# Patient Record
Sex: Female | Born: 1940 | Race: White | Hispanic: No | Marital: Single | Smoking: Never smoker
Health system: Southern US, Community
[De-identification: ages and names within clinical notes are randomized; demographics above are authoritative.]

## PROBLEM LIST (undated history)

## (undated) DIAGNOSIS — F0390 Unspecified dementia without behavioral disturbance: Secondary | ICD-10-CM

## (undated) DIAGNOSIS — E119 Type 2 diabetes mellitus without complications: Secondary | ICD-10-CM

## (undated) DIAGNOSIS — F209 Schizophrenia, unspecified: Secondary | ICD-10-CM

## (undated) DIAGNOSIS — I10 Essential (primary) hypertension: Secondary | ICD-10-CM

## (undated) HISTORY — PX: ABDOMINAL HYSTERECTOMY: SHX81

## (undated) HISTORY — PX: TONSILLECTOMY: SUR1361

---

## 2012-11-23 ENCOUNTER — Ambulatory Visit: Payer: Self-pay

## 2012-11-23 LAB — CBC WITH DIFFERENTIAL/PLATELET
Basophil %: 0.6 %
Eosinophil #: 0.2 10*3/uL (ref 0.0–0.7)
Eosinophil %: 2.1 %
HGB: 13.6 g/dL (ref 12.0–16.0)
Lymphocyte #: 2 10*3/uL (ref 1.0–3.6)
MCH: 31 pg (ref 26.0–34.0)
MCHC: 34.4 g/dL (ref 32.0–36.0)
MCV: 90 fL (ref 80–100)
Monocyte #: 0.6 x10 3/mm (ref 0.2–0.9)
Neutrophil %: 63.2 %
Platelet: 155 10*3/uL (ref 150–440)
RBC: 4.4 10*6/uL (ref 3.80–5.20)

## 2012-11-23 LAB — COMPREHENSIVE METABOLIC PANEL
Albumin: 4.5 g/dL (ref 3.4–5.0)
Alkaline Phosphatase: 139 U/L — ABNORMAL HIGH (ref 50–136)
Anion Gap: 8 (ref 7–16)
BUN: 17 mg/dL (ref 7–18)
Chloride: 101 mmol/L (ref 98–107)
Co2: 28 mmol/L (ref 21–32)
Creatinine: 1.05 mg/dL (ref 0.60–1.30)
EGFR (African American): >60
Potassium: 4.1 mmol/L (ref 3.5–5.1)
SGPT (ALT): 28 U/L (ref 12–78)
Total Protein: 9.2 g/dL — ABNORMAL HIGH (ref 6.4–8.2)

## 2012-11-23 LAB — URINALYSIS, COMPLETE
Bilirubin,UR: NEGATIVE
Glucose,UR: NEGATIVE mg/dL (ref 0–75)
Ketone: NEGATIVE
Nitrite: POSITIVE

## 2012-11-25 LAB — URINE CULTURE

## 2013-05-16 ENCOUNTER — Ambulatory Visit: Payer: Self-pay | Admitting: Physician Assistant

## 2015-03-12 ENCOUNTER — Encounter: Payer: Self-pay | Admitting: *Deleted

## 2015-03-12 ENCOUNTER — Emergency Department
Admission: EM | Admit: 2015-03-12 | Discharge: 2015-03-13 | Disposition: A | Discharge status: Other Acute Inpatient Hospital | Payer: Medicare Other | Attending: Emergency Medicine | Admitting: Emergency Medicine

## 2015-03-12 DIAGNOSIS — K5900 Constipation, unspecified: Secondary | ICD-10-CM | POA: Diagnosis not present

## 2015-03-12 DIAGNOSIS — F060 Psychotic disorder with hallucinations due to known physiological condition: Secondary | ICD-10-CM | POA: Diagnosis not present

## 2015-03-12 DIAGNOSIS — E119 Type 2 diabetes mellitus without complications: Secondary | ICD-10-CM | POA: Diagnosis not present

## 2015-03-12 DIAGNOSIS — I10 Essential (primary) hypertension: Secondary | ICD-10-CM | POA: Diagnosis not present

## 2015-03-12 DIAGNOSIS — F22 Delusional disorders: Secondary | ICD-10-CM | POA: Diagnosis not present

## 2015-03-12 DIAGNOSIS — F99 Mental disorder, not otherwise specified: Secondary | ICD-10-CM | POA: Diagnosis present

## 2015-03-12 HISTORY — DX: Essential (primary) hypertension: I10

## 2015-03-12 HISTORY — DX: Type 2 diabetes mellitus without complications: E11.9

## 2015-03-12 LAB — URINALYSIS COMPLETE WITH MICROSCOPIC (ARMC ONLY)
BILIRUBIN URINE: NEGATIVE
GLUCOSE, UA: 50 mg/dL — AB
Hgb urine dipstick: NEGATIVE
Leukocytes, UA: NEGATIVE
NITRITE: NEGATIVE
PH: 5 (ref 5.0–8.0)
Protein, ur: 100 mg/dL — AB
Specific Gravity, Urine: 1.021 (ref 1.005–1.030)

## 2015-03-12 LAB — URINE DRUG SCREEN, QUALITATIVE (ARMC ONLY)
Amphetamines, Ur Screen: NOT DETECTED
BARBITURATES, UR SCREEN: NOT DETECTED
Benzodiazepine, Ur Scrn: NOT DETECTED
COCAINE METABOLITE, UR ~~LOC~~: NOT DETECTED
Cannabinoid 50 Ng, Ur ~~LOC~~: NOT DETECTED
MDMA (ECSTASY) UR SCREEN: NOT DETECTED
METHADONE SCREEN, URINE: NOT DETECTED
Opiate, Ur Screen: NOT DETECTED
Phencyclidine (PCP) Ur S: NOT DETECTED
TRICYCLIC, UR SCREEN: NOT DETECTED

## 2015-03-12 LAB — CBC
HCT: 36.1 % (ref 35.0–47.0)
HEMOGLOBIN: 12.2 g/dL (ref 12.0–16.0)
MCH: 30.3 pg (ref 26.0–34.0)
MCHC: 33.7 g/dL (ref 32.0–36.0)
MCV: 89.6 fL (ref 80.0–100.0)
Platelets: 170 10*3/uL (ref 150–440)
RBC: 4.02 MIL/uL (ref 3.80–5.20)
RDW: 14.4 % (ref 11.5–14.5)
WBC: 5.2 10*3/uL (ref 3.6–11.0)

## 2015-03-12 LAB — SALICYLATE LEVEL

## 2015-03-12 LAB — COMPREHENSIVE METABOLIC PANEL
ALT: 17 U/L (ref 14–54)
ANION GAP: 11 (ref 5–15)
AST: 22 U/L (ref 15–41)
Albumin: 4.6 g/dL (ref 3.5–5.0)
Alkaline Phosphatase: 71 U/L (ref 38–126)
BUN: 29 mg/dL — ABNORMAL HIGH (ref 6–20)
CHLORIDE: 106 mmol/L (ref 101–111)
CO2: 24 mmol/L (ref 22–32)
Calcium: 10 mg/dL (ref 8.9–10.3)
Creatinine, Ser: 1.12 mg/dL — ABNORMAL HIGH (ref 0.44–1.00)
GFR calc non Af Amer: 48 mL/min — ABNORMAL LOW (ref >60)
GFR, EST AFRICAN AMERICAN: 55 mL/min — AB (ref >60)
Glucose, Bld: 178 mg/dL — ABNORMAL HIGH (ref 65–99)
POTASSIUM: 3.9 mmol/L (ref 3.5–5.1)
SODIUM: 141 mmol/L (ref 135–145)
Total Bilirubin: 0.9 mg/dL (ref 0.3–1.2)
Total Protein: 8.1 g/dL (ref 6.5–8.1)

## 2015-03-12 LAB — TSH: TSH: 0.84 u[IU]/mL (ref 0.350–4.500)

## 2015-03-12 LAB — ACETAMINOPHEN LEVEL

## 2015-03-12 LAB — ETHANOL: Alcohol, Ethyl (B): <5 mg/dL (ref <5)

## 2015-03-12 MED ORDER — LISINOPRIL 20 MG PO TABS
20.0000 mg | ORAL_TABLET | Freq: Every day | ORAL | Status: DC
  Filled 2015-03-12: qty 1

## 2015-03-12 MED ORDER — FLUOXETINE HCL 10 MG PO CAPS
10.0000 mg | ORAL_CAPSULE | Freq: Every day | ORAL | Status: DC
  Administered 2015-03-13: 10 mg via ORAL
  Filled 2015-03-12 (×3): qty 1

## 2015-03-12 MED ORDER — VITAMIN D (ERGOCALCIFEROL) 1.25 MG (50000 UNIT) PO CAPS
50000.0000 [IU] | ORAL_CAPSULE | ORAL | Status: DC
  Administered 2015-03-13: 50000 [IU] via ORAL
  Filled 2015-03-12: qty 1

## 2015-03-12 MED ORDER — GLIMEPIRIDE 2 MG PO TABS
8.0000 mg | ORAL_TABLET | Freq: Every day | ORAL | Status: DC
  Administered 2015-03-13: 8 mg via ORAL
  Filled 2015-03-12: qty 4

## 2015-03-12 MED ORDER — ATORVASTATIN CALCIUM 20 MG PO TABS
20.0000 mg | ORAL_TABLET | Freq: Every day | ORAL | Status: DC
  Administered 2015-03-13: 20 mg via ORAL
  Filled 2015-03-12: qty 1

## 2015-03-12 MED ORDER — METFORMIN HCL ER 500 MG PO TB24
1000.0000 mg | ORAL_TABLET | Freq: Two times a day (BID) | ORAL | Status: DC
  Administered 2015-03-13 (×2): 1000 mg via ORAL
  Filled 2015-03-12 (×4): qty 2

## 2015-03-12 NOTE — BH Assessment (Signed)
Writer informed TTS (Krisdin) of the assessment.

## 2015-03-12 NOTE — ED Notes (Signed)
Pt. Noted sleeping in room. No complaints or concerns voiced. No distress or abnormal behavior noted. Will continue to monitor with security cameras. Q 15 minute rounds continue. 

## 2015-03-12 NOTE — ED Notes (Signed)
BEHAVIORAL HEALTH ROUNDING Patient sleeping: No. Patient alert and oriented: yes Behavior appropriate: Yes.  ; If no, describe:  Nutrition and fluids offered: yes Toileting and hygiene offered: Yes  Sitter present: q15 minute observations and security camera monitoring Law enforcement present: Yes  ODS  

## 2015-03-12 NOTE — ED Notes (Signed)
Hand-off report given to Gary, RN.  

## 2015-03-12 NOTE — ED Notes (Signed)
Unable to remove ring finger from patient, knuckle enlarged. Will make BHU RN aware.
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
## 2015-03-12 NOTE — ED Notes (Signed)
Pt. To BHU from ED ambulatory without difficulty, to room 1 . Report from Luis RN. Pt. Is alert and oriented, warm and dry in no distress. Pt. Denies SI, HI, and AVH. Pt. Calm and cooperative. Pt. Made aware of security cameras and Q15 minute rounds. Pt. Encouraged to let Nursing staff know of any concerns or needs.  

## 2015-03-12 NOTE — ED Notes (Signed)
Pt observed ambulating to the BR  NAD observed

## 2015-03-12 NOTE — ED Notes (Signed)
Pt states  "I think my son thinks that I am bipolar or schizophrenic - I guess that he is worried about me having dementia."

## 2015-03-12 NOTE — BH Assessment (Signed)
Per,  Dr. Garnetta BuddyFaheem- observe overnight and reevaluate by psychiatry in the AM .

## 2015-03-12 NOTE — ED Notes (Signed)
Yellow metal ring removed and placed with belongings.

## 2015-03-12 NOTE — ED Notes (Signed)

## 2015-03-12 NOTE — ED Notes (Signed)
TTS called in. Will be connecting with patient soon.
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
## 2015-03-12 NOTE — BHH Counselor (Signed)
Pt son Thereasa DistanceBrian Illes called to give collateral information. He states that his mother has been acting more and more bizarre and paranoid over the past couple of months and he is concerned about her safety. He states that she "talks to his dead father and dead uncle" and she says that she hears their voices. He states that he remembers her being paranoid in the past but does not believe that she has ever been admitted to a psychiatric unit or been treated for psychiatric issues. He states that she is very paranoid now and thinks that the [employer] is trying to take her apartment and that people are following her.   Kateri PlummerKristin Jenice Leiner, M.S., LPCA, [place]ASA, [facility] Licensed Professional Counselor Associate  Triage Specialist  [facility]  Therapeutic Triage Services Phone: [phone] Fax: [phone]
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
## 2015-03-13 ENCOUNTER — Inpatient Hospital Stay
Admission: EM | Admit: 2015-03-13 | Discharge: 2015-03-28 | DRG: 886 | Disposition: A | Discharge status: Home / Self Care | Payer: Medicare Other | Attending: Psychiatry | Admitting: Psychiatry

## 2015-03-13 DIAGNOSIS — Z888 Allergy status to other drugs, medicaments and biological substances status: Secondary | ICD-10-CM | POA: Diagnosis not present

## 2015-03-13 DIAGNOSIS — F329 Major depressive disorder, single episode, unspecified: Secondary | ICD-10-CM | POA: Diagnosis present

## 2015-03-13 DIAGNOSIS — I10 Essential (primary) hypertension: Secondary | ICD-10-CM

## 2015-03-13 DIAGNOSIS — E559 Vitamin D deficiency, unspecified: Secondary | ICD-10-CM

## 2015-03-13 DIAGNOSIS — I129 Hypertensive chronic kidney disease with stage 1 through stage 4 chronic kidney disease, or unspecified chronic kidney disease: Secondary | ICD-10-CM | POA: Diagnosis present

## 2015-03-13 DIAGNOSIS — F29 Unspecified psychosis not due to a substance or known physiological condition: Secondary | ICD-10-CM

## 2015-03-13 DIAGNOSIS — E11649 Type 2 diabetes mellitus with hypoglycemia without coma: Secondary | ICD-10-CM | POA: Diagnosis present

## 2015-03-13 DIAGNOSIS — I680 Cerebral amyloid angiopathy: Secondary | ICD-10-CM | POA: Diagnosis present

## 2015-03-13 DIAGNOSIS — G4700 Insomnia, unspecified: Secondary | ICD-10-CM | POA: Diagnosis present

## 2015-03-13 DIAGNOSIS — E119 Type 2 diabetes mellitus without complications: Secondary | ICD-10-CM

## 2015-03-13 DIAGNOSIS — E1122 Type 2 diabetes mellitus with diabetic chronic kidney disease: Secondary | ICD-10-CM | POA: Diagnosis present

## 2015-03-13 DIAGNOSIS — E7800 Pure hypercholesterolemia, unspecified: Secondary | ICD-10-CM | POA: Diagnosis present

## 2015-03-13 DIAGNOSIS — F0391 Unspecified dementia with behavioral disturbance: Secondary | ICD-10-CM | POA: Insufficient documentation

## 2015-03-13 DIAGNOSIS — F919 Conduct disorder, unspecified: Secondary | ICD-10-CM | POA: Diagnosis present

## 2015-03-13 DIAGNOSIS — F419 Anxiety disorder, unspecified: Secondary | ICD-10-CM | POA: Diagnosis present

## 2015-03-13 DIAGNOSIS — D649 Anemia, unspecified: Secondary | ICD-10-CM | POA: Diagnosis present

## 2015-03-13 DIAGNOSIS — E538 Deficiency of other specified B group vitamins: Secondary | ICD-10-CM | POA: Diagnosis present

## 2015-03-13 DIAGNOSIS — F060 Psychotic disorder with hallucinations due to known physiological condition: Secondary | ICD-10-CM | POA: Diagnosis not present

## 2015-03-13 DIAGNOSIS — F22 Delusional disorders: Secondary | ICD-10-CM | POA: Diagnosis present

## 2015-03-13 DIAGNOSIS — Z811 Family history of alcohol abuse and dependence: Secondary | ICD-10-CM | POA: Diagnosis not present

## 2015-03-13 DIAGNOSIS — M810 Age-related osteoporosis without current pathological fracture: Secondary | ICD-10-CM | POA: Diagnosis present

## 2015-03-13 DIAGNOSIS — E785 Hyperlipidemia, unspecified: Secondary | ICD-10-CM

## 2015-03-13 DIAGNOSIS — N189 Chronic kidney disease, unspecified: Secondary | ICD-10-CM | POA: Diagnosis present

## 2015-03-13 DIAGNOSIS — Z9842 Cataract extraction status, left eye: Secondary | ICD-10-CM | POA: Diagnosis not present

## 2015-03-13 DIAGNOSIS — E8881 Metabolic syndrome: Secondary | ICD-10-CM | POA: Diagnosis present

## 2015-03-13 DIAGNOSIS — K219 Gastro-esophageal reflux disease without esophagitis: Secondary | ICD-10-CM | POA: Diagnosis present

## 2015-03-13 DIAGNOSIS — H9190 Unspecified hearing loss, unspecified ear: Secondary | ICD-10-CM

## 2015-03-13 DIAGNOSIS — E854 Organ-limited amyloidosis: Secondary | ICD-10-CM | POA: Diagnosis present

## 2015-03-13 DIAGNOSIS — F03918 Unspecified dementia, unspecified severity, with other behavioral disturbance: Secondary | ICD-10-CM | POA: Insufficient documentation

## 2015-03-13 DIAGNOSIS — I629 Nontraumatic intracranial hemorrhage, unspecified: Secondary | ICD-10-CM | POA: Diagnosis present

## 2015-03-13 DIAGNOSIS — I679 Cerebrovascular disease, unspecified: Secondary | ICD-10-CM

## 2015-03-13 DIAGNOSIS — Z9841 Cataract extraction status, right eye: Secondary | ICD-10-CM | POA: Diagnosis not present

## 2015-03-13 DIAGNOSIS — F0281 Dementia in other diseases classified elsewhere with behavioral disturbance: Secondary | ICD-10-CM

## 2015-03-13 DIAGNOSIS — Z90710 Acquired absence of both cervix and uterus: Secondary | ICD-10-CM | POA: Diagnosis not present

## 2015-03-13 DIAGNOSIS — F02B18 Dementia in other diseases classified elsewhere, moderate, with other behavioral disturbance: Secondary | ICD-10-CM

## 2015-03-13 LAB — GLUCOSE, CAPILLARY
GLUCOSE-CAPILLARY: 199 mg/dL — AB (ref 65–99)
GLUCOSE-CAPILLARY: 170 mg/dL — AB (ref 65–99)
Glucose-Capillary: 147 mg/dL — ABNORMAL HIGH (ref 65–99)

## 2015-03-13 MED ORDER — RISPERIDONE 0.5 MG PO TABS
0.2500 mg | ORAL_TABLET | Freq: Every day | ORAL | Status: DC
  Administered 2015-03-13: 0.25 mg via ORAL
  Filled 2015-03-13: qty 1

## 2015-03-13 MED ORDER — METFORMIN HCL 500 MG PO TABS
ORAL_TABLET | ORAL | Status: AC
  Filled 2015-03-13: qty 2

## 2015-03-13 MED ORDER — RISPERIDONE 1 MG PO TABS
ORAL_TABLET | ORAL | Status: AC
  Administered 2015-03-13: 0.25 mg via ORAL
  Filled 2015-03-13: qty 1

## 2015-03-13 NOTE — ED Notes (Signed)
Patient resting quietly in room. No noted distress or abnormal behaviors noted. Will continue 15 minute checks and observation by security camera for safety. 

## 2015-03-13 NOTE — ED Notes (Signed)
Pt. Noted in room lying in bed with  Complaint having pain at the back of her head; denies having an actual headache; has a h/o hypertension; B/P: 123/61; no other  concerns voiced. No  abnormal behavior noted. Will continue to monitor with security cameras. Q 15 minute rounds continue.

## 2015-03-13 NOTE — ED Notes (Signed)
Report received from Reather ConverseKarena T., RN. Pt. Alert and oriented in no distress verbalizes having  SI; with no plan; denies HI, AVH and pain.  Pt. Instructed to come to me with problems or concerns.Will continue to monitor for safety via security cameras and Q 15 minute checks.

## 2015-03-13 NOTE — ED Notes (Signed)
Pt. Noted sleeping in room. No complaints or concerns voiced. No distress or abnormal behavior noted. Will continue to monitor with security cameras. Q 15 minute rounds continue. 

## 2015-03-13 NOTE — ED Notes (Signed)
Snack and beverage given. 

## 2015-03-13 NOTE — ED Notes (Signed)
Patient in bed resting. Patient shows no signs of distress and has no complaints at this time. Maintained on 15 minute checks and observation by security camera for safety.
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
## 2015-03-13 NOTE — ED Notes (Signed)
Patient resting quietly in dayroom. Patient shows no signs of distress at this time. Maintained on 15 minute checks and observation by security camera for safety.

## 2015-03-13 NOTE — Progress Notes (Signed)
Patient ID: Laurie Morton, female   DOB: 1940/04/28, 74 y.o.   MRN: [id number] Pt admitted to unit without incident. Pt denies AVH / SI / HI but has obvious delusions of people taking money from her, wrecking her car, and spying on her.  Skin assessment was completed and no contraband was found.  Pt has no tattoos, no skin tears, no cuts or abrasions. Pt scores pain at a 0 out of ten but does state she has a bump on the back of her head that did hurt recently.  Pt belongings were locked up.  Pt was cooperative in answering questions, stated that "I'm here because I can't afford things because people are taking money from me."

## 2015-03-13 NOTE — Plan of Care (Signed)
Problem: Alteration in thought process Goal: LTG-Patient behavior demonstrates decreased signs psychosis (Patient behavior demonstrates decreased signs of psychosis to the point the patient is safe to return home and continue treatment in an outpatient setting.) Outcome: Not Progressing Pt has several paranoid delusions such as people taking money out of her account, driving her car and wrecking it when she is not there, people spying on her.

## 2015-03-13 NOTE — ED Notes (Signed)
Patient refused to take 20 mg lisinopril because she stated the dose was too high and it put her in the hospital previously. Outpatient dosage was adjusted to 20 mg.

## 2015-03-13 NOTE — ED Notes (Signed)
Patient asleep in room. No noted distress or abnormal behavior. Will continue 15 minute checks and observation by security cameras for safety. 

## 2015-03-13 NOTE — Tx Team (Signed)
Initial Interdisciplinary Treatment Plan   PATIENT STRESSORS: Financial difficulties Health problems   PATIENT STRENGTHS: Active sense of humor Average or above average intelligence Capable of independent living   PROBLEM LIST: Problem List/Patient Goals Date to be addressed Date deferred Reason deferred Estimated date of resolution  Psychosis 03/13/2015                                                      DISCHARGE CRITERIA:  Ability to meet basic life and health needs Verbal commitment to aftercare and medication compliance  PRELIMINARY DISCHARGE PLAN: Outpatient therapy  PATIENT/FAMIILY INVOLVEMENT: This treatment plan has been presented to and reviewed with the patient, Laurie Morton.  The patient has been given the opportunity to ask questions and make suggestions.  GrenadaBrittany A Lige Lakeman 03/13/2015, 10:33 PM

## 2015-03-13 NOTE — ED Notes (Signed)
ENVIRONMENTAL ASSESSMENT Potentially harmful objects out of patient reach: Yes Personal belongings secured: Yes Patient dressed in hospital provided attire only: Yes Plastic bags out of patient reach: Yes Patient care equipment (cords, cables, call bells, lines, and drains) shortened, removed, or accounted for: Yes Equipment and supplies removed from bottom of stretcher: Yes Potentially toxic materials out of patient reach: Yes Sharps container removed or out of patient reach: Yes  Patient sleeping in room. Patient shows no signs of distress. Maintained on 15 minute checks and observation by security camera for safety.

## 2015-03-13 NOTE — ED Notes (Signed)
Patient in dayroom watching television. Patient has no complaints at this time. Maintained on 15 minute checks and observation by security camera for safety.

## 2015-03-13 NOTE — ED Notes (Signed)
Pt. Noted in room sitting on the side of the bed; . No complaints or concerns voiced. No distress or abnormal behavior noted. Will continue to monitor with security cameras. Q 15 minute rounds continue. 

## 2015-03-13 NOTE — ED Notes (Signed)
Patient received breakfast tray 

## 2015-03-13 NOTE — ED Notes (Addendum)
Patient stated that she experienced a period of acid reflux and then vomited all over the pillow and sheets. Patient said that she felt a pain in her chest after eating lunch and then felt the reflux coming up. Nurse cleaned patients linens and offered to get an order for a medicine to relief reflux symptoms and patient refused this intervention. Nurse offered patient ginger ale to relieve stomach upset. Will continue to monitor.

## 2015-03-13 NOTE — ED Notes (Signed)
Patient is clam and cooperative and complaint with scheduled medication. Patient says that she feels better now that she is performing her ADLs and she appears to be less anxious and is visible on the unit.
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
## 2015-03-14 ENCOUNTER — Encounter: Payer: Self-pay | Admitting: Psychiatry

## 2015-03-14 DIAGNOSIS — E119 Type 2 diabetes mellitus without complications: Secondary | ICD-10-CM

## 2015-03-14 DIAGNOSIS — K219 Gastro-esophageal reflux disease without esophagitis: Secondary | ICD-10-CM

## 2015-03-14 DIAGNOSIS — I10 Essential (primary) hypertension: Secondary | ICD-10-CM

## 2015-03-14 DIAGNOSIS — E785 Hyperlipidemia, unspecified: Secondary | ICD-10-CM

## 2015-03-14 DIAGNOSIS — H9190 Unspecified hearing loss, unspecified ear: Secondary | ICD-10-CM

## 2015-03-14 DIAGNOSIS — E559 Vitamin D deficiency, unspecified: Secondary | ICD-10-CM

## 2015-03-14 LAB — AMMONIA: AMMONIA: 19 umol/L (ref 9–35)

## 2015-03-14 LAB — LIPID PANEL
Cholesterol: 242 mg/dL — ABNORMAL HIGH (ref 0–200)
HDL: 44 mg/dL (ref >40)
LDL CALC: 125 mg/dL — AB (ref 0–99)
Total CHOL/HDL Ratio: 5.5 RATIO
Triglycerides: 363 mg/dL — ABNORMAL HIGH (ref <150)
VLDL: 73 mg/dL — ABNORMAL HIGH (ref 0–40)

## 2015-03-14 LAB — HEMOGLOBIN A1C: HEMOGLOBIN A1C: 7.4 % — AB (ref 4.0–6.0)

## 2015-03-14 LAB — VITAMIN B12: VITAMIN B 12: 227 pg/mL (ref 180–914)

## 2015-03-14 MED ORDER — VITAMIN D (ERGOCALCIFEROL) 1.25 MG (50000 UNIT) PO CAPS
50000.0000 [IU] | ORAL_CAPSULE | ORAL | Status: DC
  Administered 2015-03-20 – 2015-03-27 (×2): 50000 [IU] via ORAL
  Filled 2015-03-14 (×2): qty 1

## 2015-03-14 MED ORDER — LISINOPRIL 5 MG PO TABS
5.0000 mg | ORAL_TABLET | Freq: Every day | ORAL | Status: DC
  Administered 2015-03-15: 5 mg via ORAL
  Filled 2015-03-14 (×2): qty 1

## 2015-03-14 MED ORDER — GEMFIBROZIL 600 MG PO TABS
600.0000 mg | ORAL_TABLET | Freq: Two times a day (BID) | ORAL | Status: DC
  Administered 2015-03-14 – 2015-03-28 (×28): 600 mg via ORAL
  Filled 2015-03-14 (×30): qty 1

## 2015-03-14 MED ORDER — ATORVASTATIN CALCIUM 20 MG PO TABS
20.0000 mg | ORAL_TABLET | Freq: Every day | ORAL | Status: DC
  Administered 2015-03-14: 20 mg via ORAL
  Filled 2015-03-14: qty 1

## 2015-03-14 MED ORDER — MAGNESIUM HYDROXIDE 400 MG/5ML PO SUSP: 30.0000 mL | Freq: Every day | ORAL | Status: DC | PRN

## 2015-03-14 MED ORDER — RISPERIDONE 1 MG PO TABS
0.5000 mg | ORAL_TABLET | Freq: Two times a day (BID) | ORAL | Status: DC
Start: 2015-03-14 — End: 2015-03-16
  Administered 2015-03-14 – 2015-03-16 (×5): 0.5 mg via ORAL
  Filled 2015-03-14 (×5): qty 1

## 2015-03-14 MED ORDER — PANTOPRAZOLE SODIUM 40 MG PO TBEC
40.0000 mg | DELAYED_RELEASE_TABLET | Freq: Every day | ORAL | Status: DC
  Administered 2015-03-14 – 2015-03-28 (×15): 40 mg via ORAL
  Filled 2015-03-14 (×15): qty 1

## 2015-03-14 MED ORDER — ACETAMINOPHEN 325 MG PO TABS
650.0000 mg | ORAL_TABLET | Freq: Four times a day (QID) | ORAL | Status: DC | PRN
  Administered 2015-03-25: 650 mg via ORAL
  Filled 2015-03-14: qty 2

## 2015-03-14 MED ORDER — ALUM & MAG HYDROXIDE-SIMETH 200-200-20 MG/5ML PO SUSP
30.0000 mL | ORAL | Status: DC | PRN
Start: 2015-03-14 — End: 2015-03-28

## 2015-03-14 MED ORDER — GLIMEPIRIDE 2 MG PO TABS
8.0000 mg | ORAL_TABLET | Freq: Every day | ORAL | Status: DC
  Administered 2015-03-14 – 2015-03-19 (×3): 8 mg via ORAL
  Filled 2015-03-14: qty 2
  Filled 2015-03-14: qty 4
  Filled 2015-03-14: qty 2
  Filled 2015-03-14: qty 4
  Filled 2015-03-14: qty 2
  Filled 2015-03-14 (×4): qty 4

## 2015-03-14 MED ORDER — METFORMIN HCL ER 500 MG PO TB24
1000.0000 mg | ORAL_TABLET | Freq: Two times a day (BID) | ORAL | Status: DC
  Administered 2015-03-14 – 2015-03-19 (×8): 1000 mg via ORAL
  Filled 2015-03-14 (×12): qty 2

## 2015-03-14 MED ORDER — RISPERIDONE 0.25 MG PO TABS
0.2500 mg | ORAL_TABLET | Freq: Every day | ORAL | Status: DC
  Filled 2015-03-14: qty 1

## 2015-03-14 MED ORDER — FLUOXETINE HCL 10 MG PO CAPS
10.0000 mg | ORAL_CAPSULE | Freq: Every day | ORAL | Status: DC
  Administered 2015-03-14: 10 mg via ORAL
  Filled 2015-03-14: qty 1

## 2015-03-14 MED ORDER — FLUTICASONE PROPIONATE 50 MCG/ACT NA SUSP
2.0000 | Freq: Every day | NASAL | Status: DC
  Administered 2015-03-14 – 2015-03-28 (×15): 2 via NASAL
  Filled 2015-03-14: qty 16

## 2015-03-14 MED ORDER — FENOFIBRATE 54 MG PO TABS
54.0000 mg | ORAL_TABLET | Freq: Every day | ORAL | Status: DC
  Administered 2015-03-14 – 2015-03-28 (×15): 54 mg via ORAL
  Filled 2015-03-14 (×15): qty 1

## 2015-03-14 MED ORDER — SERTRALINE HCL 25 MG PO TABS
25.0000 mg | ORAL_TABLET | Freq: Every day | ORAL | Status: DC
  Administered 2015-03-15: 25 mg via ORAL
  Filled 2015-03-14: qty 1

## 2015-03-14 MED ORDER — LISINOPRIL 20 MG PO TABS
20.0000 mg | ORAL_TABLET | Freq: Every day | ORAL | Status: DC
  Filled 2015-03-14: qty 1

## 2015-03-14 NOTE — Plan of Care (Signed)
Problem: Ineffective individual coping Goal: STG: Pt will be able to identify effective and ineffective STG: Pt will be able to identify effective and ineffective coping patterns  Outcome: Progressing Patient cooperative with meds and plan of care.

## 2015-03-14 NOTE — Progress Notes (Signed)
Recreation Therapy Notes  Date: 12.21.16 Time: 3:00 pm Location: Craft Room  Group Topic: Self-esteem  Goal Area(s) Addresses:  Patient will write at least one positive attribute about self. Patient will verbalize benefit of having a healthy self-esteem.  Behavioral Response: Attentive, Interactive  Intervention: I Am  Activity: Patients were given a worksheet with the letter I on it and instructed to write as many positive traits about themselves inside the letter.  Education: LRT educated patients on different ways to increase their self-esteem  Education Outcome: Acknowledges education/In group clarification offered   Clinical Observations/Feedback: Patient completed activity by telling LRT positive traits to write down. Patient contributed to group discussion by stating it was difficult to think of positive traits, and how her self-esteem affects her.  Jacquelynn CreeGreene,Devorah Givhan M, LRT/CTRS 03/14/2015 4:02 PM

## 2015-03-14 NOTE — [facility] Group Notes (Signed)
[facility] LCSW Group Therapy   03/14/2015 1:15 PM   Type of Therapy: Group Therapy   Participation Level: Active   Participation Quality: Attentive, Sharing and Supportive   Affect: Depressed and Flat   Cognitive: Alert and Oriented   Insight: Developing/Improving and Engaged   Engagement in Therapy: Developing/Improving and Engaged   Modes of Intervention: Clarification, Confrontation, Discussion, Education, Exploration, Limit-setting, Orientation, Problem-solving, Rapport Building, Dance movement psychotherapisteality Testing, Socialization and Support   Summary of Progress/Problems: The topic for group today was emotional regulation. This group focused on both positive and negative emotion identification and allowed  group members to process ways to identify feelings, regulate negative emotions, and find healthy ways to manage internal/external emotions. Group members were asked to reflect on a time when their reaction to an emotion led to a negative outcome and explored how alternative responses using emotion regulation would have benefited them. Group members were also asked to discuss a time when emotion regulation was utilized when a negative emotion was experienced. 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  Pt presented as delusional and became tearful and manic when sharing her fears.  CSW validated her emotions and expressed gratitude to the pt for her having the courage to share.  Pt responded well to validation and became calm once more.  Pt was polite and cooperative with the CSW and other group members and focused and attentive to the topics discussed and the sharing of others.     Dorothe PeaJonathan F. Tonetta Napoles, MSW, LCSWA, LCAS
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
## 2015-03-14 NOTE — Progress Notes (Signed)
Patient with appropriate affect, cooperative behavior with meds and plan of care. No SI/HI at this time. Minimal interaction with peers. Verbalizes needs appropriately to staff. Appropriate when staff initiates interaction. No complaint, no distress, safety maintained. Sleeping in bed at this time.

## 2015-03-14 NOTE — Progress Notes (Signed)
D: I dont know why  I am here ." I don't want  To be here at Christmas " Appropriate ADL'S and personal chores . Limited  Interaction with peers . No group participations  Appetite good .  Patient paced around the nursing station  Continuously. Thought process altered  Suspicious and paranoid A: Encourage patient participation with unit programming . Instruction  Given on  Medication , verbalize understanding. R: Voice no other concerns. Staff continue to monitor

## 2015-03-14 NOTE — [facility] Group Notes (Signed)
[facility]   03/14/2015 9:15 AM  ?  Participation Quality: Alert, Appropriate and Oriented   Mood/Affect: Depressed and Flat   Depression Rating: 5  Anxiety Rating: 5  Thoughts of Suicide: Pt denies SI/HI   Will you contract for safety? Yes   Current AVH: Pt denies   Plan for Discharge/Comments: Pt attended discharge planning group and actively participated in group. CSW provided pt with today's workbook.  Pt shared with the group her outlook is improving due to the support she receives from her son, but that she was hoping to improve those relationships upon dicharge.  Pt discussed her desire for healthy coping mechanisms to assist her in dealing with her emotions.  Transportation Means: Pt reports access to transportation   Supports: Pt lists her sons as her primary supports      Laurie PeaJonathan F. [name], MSW, LCSWA, LCAS

## 2015-03-14 NOTE — Plan of Care (Signed)
Problem: Alteration in thought process Goal: LTG-Patient behavior demonstrates decreased signs psychosis (Patient behavior demonstrates decreased signs of psychosis to the point the patient is safe to return home and continue treatment in an outpatient setting.)  Outcome: Not Progressing Patient currently in Denial  About her being here on the unit

## 2015-03-14 NOTE — [facility] Suicide Risk Assessment (Signed)
[facility]   Nursing information obtained from:    Demographic factors:    Current Mental Status:    Loss Factors:    Historical Factors:    Risk Reduction Factors:    Total Time spent with patient: 1 hour Principal Problem: <principal problem not specified> Diagnosis:   Patient Active Problem List   Diagnosis Date Noted  . 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 Psychosis [F29] 03/14/2015  . 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  World Science writerHealth Organization [facility](WHO). Score between 0-7:  no or low risk or alcohol related problems. Score between 8-15:  moderate risk of alcohol related problems. Score between 16-19:  high risk of alcohol related problems. Score 20 or above:  warrants further diagnostic evaluation for alcohol dependence and treatment.   CLINICAL FACTORS:   Currently Psychotic    Psychiatric Specialty Exam: Physical Exam  ROS                                                           COGNITIVE FEATURES THAT CONTRIBUTE TO RISK:  Closed-mindedness    SUICIDE RISK:   Mild:  Suicidal ideation of limited frequency, intensity, duration, and specificity.  There are no identifiable plans, no associated intent, mild dysphoria and related symptoms, good self-control (both objective and subjective assessment), few other risk factors, and identifiable protective factors, including available and accessible social support.  [facility]  Medical Decision Making:  New problem, with additional work up planned  I certify that inpatient services furnished can reasonably be expected to improve the patient's condition.   Jimmy FootmanHernandez-Gonzalez,  Kreg Earhart 03/14/2015, 11:05 AM

## 2015-03-14 NOTE — Progress Notes (Signed)
Recreation Therapy Notes  INPATIENT RECREATION THERAPY ASSESSMENT  Patient Details Name: Laurie Morton MRN: [id number] DOB: 1940-12-09 Today's Date: 03/14/2015  Patient Stressors: Death, Other (Comment) (ex-sister-in-law passed away; granddaughter needs a heart)  Coping Skills:   Isolate, Arguments, Avoidance, Exercise, Art/Dance, Talking, Music, Sports, Other (Comment) (Watch grandkids, decorate)  Personal Challenges: Communication, Decision-Making, Expressing Yourself, Problem-Solving, Relationships, Self-Esteem/Confidence, Social Interaction, Stress Management  Leisure Interests (2+):  Individual - Other (Comment) (Bowling, dancing)  Awareness of Community Resources:  Yes  Community Resources:  [place], Other (Comment) (Restuarants)  Current Use: No  If no, Barriers?: Financial  Patient Strengths:  Learnign to not care as much about material things, learning to be around more positive people  Patient Identified Areas of Improvement:  Confidence and a higher self-esteem  Current Recreation Participation:  Watch movies on TV  Patient Goal for Hospitalization:  Try to prove to people there is nothing wrong with her  [place]  County of Residence:  [place]   Current SI (including self-harm):  No  Current HI:  No  Consent to Intern Participation: N/A   Jacquelynn CreeGreene,Binnie Droessler M, LRT/CTRS 03/14/2015, 12:34 PM

## 2015-03-14 NOTE — Progress Notes (Signed)
Initial Nutrition Assessment   INTERVENTION:   Meals and Snacks: Cater to patient preferences Medical Food Supplement Therapy: will recommend on follow if intake poor   NUTRITION DIAGNOSIS:   No nutrition diagnosis at this time  GOAL:   Patient will meet greater than or equal to 90% of their needs  MONITOR:    (Energy Intake, Glucose Profile, Anthropometrics)  REASON FOR ASSESSMENT:   Malnutrition Screening Tool    ASSESSMENT:   Pt admitted with psychosis in elderly.  Past Medical History  Diagnosis Date  . Diabetes mellitus without complication (HCC)   . Hypertension      Diet Order:  Diet heart healthy/carb modified Room service appropriate?: Yes; Fluid consistency:: Thin    Current Nutrition: Current po intake 95-100% of meals. RD notes in H&P pt made reference to financial hardships and pt has been eating peanut butter and soup.   Scheduled Medications:  . fenofibrate  54 mg Oral Daily  . fluticasone  2 spray Each Nare Daily  . gemfibrozil  600 mg Oral BID AC  . glimepiride  8 mg Oral Q breakfast  . [START ON 03/15/2015] lisinopril  5 mg Oral Daily  . metFORMIN  1,000 mg Oral BID WC  . pantoprazole  40 mg Oral Daily  . risperiDONE  0.5 mg Oral BID  . [START ON 03/15/2015] sertraline  25 mg Oral Daily  . [START ON 03/20/2015] Vitamin D (Ergocalciferol)  50,000 Units Oral Weekly    Electrolyte/Renal Profile and Glucose Profile:   Recent Labs Lab 03/12/15 1148  NA 141  K 3.9  CL 106  CO2 24  BUN 29*  CREATININE 1.12*  CALCIUM 10.0  GLUCOSE 178*   Protein Profile:  Recent Labs Lab 03/12/15 1148  ALBUMIN 4.6    Weight Change: Per MST pt with weight loss PTA. [facility].   Height:   Ht Readings from Last 1 Encounters:  03/13/15 5\' 4"  (1.626 m)    Weight:   Wt Readings from Last 1 Encounters:  03/13/15 121 lb (54.885 kg)     BMI:  Body mass index is 20.76 kg/(m^2).   EDUCATION NEEDS:   No education needs  identified at this time   LOW Care Level  Leda QuailAllyson Oron Westrup, RD, LDN Pager [phone] Weekend/On-Call Pager [phone]

## 2015-03-15 ENCOUNTER — Inpatient Hospital Stay: Payer: Medicare Other

## 2015-03-15 LAB — RPR: RPR Ser Ql: NONREACTIVE

## 2015-03-15 LAB — HIV ANTIBODY (ROUTINE TESTING W REFLEX): HIV SCREEN 4TH GENERATION: NONREACTIVE

## 2015-03-15 LAB — GLUCOSE, CAPILLARY
Glucose-Capillary: 81 mg/dL (ref 65–99)
Glucose-Capillary: 135 mg/dL — ABNORMAL HIGH (ref 65–99)

## 2015-03-15 MED ORDER — SERTRALINE HCL 25 MG PO TABS
12.5000 mg | ORAL_TABLET | Freq: Every day | ORAL | Status: DC
Start: 2015-03-16 — End: 2015-03-17
  Administered 2015-03-16: 12.5 mg via ORAL
  Filled 2015-03-15 (×2): qty 1

## 2015-03-15 NOTE — Progress Notes (Signed)
Recreation Therapy Notes  Date: 12.22.16 Time: 3:00 pm Location: Craft Room  Group Topic: Coping Skills/Leisure Education  Goal Area(s) Addresses:  Patient will identify things they are grateful for. Patient will verbalize understanding how being grateful can influence decision making.  Behavioral Response: Did not attend  Intervention: Grateful Wheel  Activity: Patients were given an "I Am Grateful For" worksheet and instructed to list 2-3 things they are grateful for under each category.  Education: LRT educated patients on how being aware of what they are grateful for can affect their decision making skills.  Education Outcome: Patient did not attend group.  Clinical Observations/Feedback: Patient did not attend group.  Jacquelynn CreeGreene,Shyloh Krinke M, LRT/CTRS 03/15/2015 4:15 PM

## 2015-03-15 NOTE — Plan of Care (Signed)
Problem: Ineffective individual coping Goal: STG: Patient will remain free from self harm Outcome: Not Progressing No self harm reported or observed     
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
## 2015-03-15 NOTE — Progress Notes (Signed)
No distress, no complaint. Slept 7 hours, safety maintained.

## 2015-03-15 NOTE — [facility] Group Notes (Signed)
[facility] LCSW Group Therapy   03/15/2015 11:00 am  Type of Therapy: Group Therapy   Participation Level: Did Not Attend. Patient invited to participate but declined.    [name] F. [name], MSW, LCSWA, LCAS   

## 2015-03-15 NOTE — Plan of Care (Signed)
Problem: [facility] Participation in Recreation Therapeutic Interventions Goal: STG-Patient will demonstrate improved self esteem by identif STG: Self-Esteem - Within 4 treatment sessions, patient will verbalize at least 5 positive affirmation statements in each of 2 treatment sessions to increase self-esteem post d/c.  Outcome: Progressing Treatment Session 1; Completed 1 out of 2: At approximately 2:30 pm, LRT met with patient in patient room. Patient verbalized 5 positive affirmation statements. Patient reported it felt good. LRT encouraged patient to continue saying positive affirmation statements. Intervention Used: I Am statements  Leonette Monarch, LRT/CTRS 12.22.16 2:47 pm Goal: STG-Other Recreation Therapy Goal (Specify) STG: Stress Management - Within 4 treatment sessions, patient will verbalize understanding of the stress management techniques in each of 2 treatment sessions to increase stress management skills post d/c.  Outcome: Progressing Treatment Session 1; Completed 1 out of 2: At approximately 2:30 pm, LRT met with patient in patient room. LRT educated and provided patient with handouts on stress management techniques. Patient verbalized understanding. LRT encouraged patient to read over and practice the stress management techniques. Intervention Used: Stress Management handouts  Leonette Monarch, LRT/CTRS 12.22.16 2:48 pm

## 2015-03-15 NOTE — Progress Notes (Signed)
Pt continues to be confused, refused MRI, thinks people are trying to kill her. No group. Minimal interaction with staff and peers. Encouragement and support offered.  Pt receptive and remains safe on unit.

## 2015-03-15 NOTE — Progress Notes (Addendum)
D: Pt continues to be confused, Disoriented to situation. Wants to be discharged to go home and [facility]. A: Encouragement and support offered, medications given as prescribed. Q15 minute checks maintained for safety. R: Pt receptive and remains safe on unit, calm, cooperative. Went to day room during snack time, did not attend group.

## 2015-03-15 NOTE — [facility] Group Notes (Signed)
[facility] Group Notes:  (Nursing/MHT/Case Management/Adjunct)  Date:  03/15/2015  Time:  2:14 PM  Type of Therapy:  Psychoeducational Skills  Participation Level:  Did Not Attend    [name] 03/15/2015, 2:14 PM

## 2015-03-16 LAB — GLUCOSE, CAPILLARY
Glucose-Capillary: 75 mg/dL (ref 65–99)
Glucose-Capillary: 70 mg/dL (ref 65–99)

## 2015-03-16 MED ORDER — LORAZEPAM 0.5 MG PO TABS
0.2500 mg | ORAL_TABLET | Freq: Four times a day (QID) | ORAL | Status: DC | PRN
  Administered 2015-03-23: 0.25 mg via ORAL
  Filled 2015-03-16 (×2): qty 1

## 2015-03-16 MED ORDER — RISPERIDONE 1 MG PO TABS
2.0000 mg | ORAL_TABLET | Freq: Every day | ORAL | Status: DC
  Filled 2015-03-16: qty 2

## 2015-03-16 MED ORDER — GLUCERNA SHAKE PO LIQD
237.0000 mL | Freq: Three times a day (TID) | ORAL | Status: DC
  Administered 2015-03-16 – 2015-03-28 (×31): 237 mL via ORAL
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
## 2015-03-16 NOTE — [facility] Group Notes (Signed)
[facility] Group Notes:  (Nursing/MHT/Case Management/Adjunct)  Date:  03/16/2015  Time:  3:20 PM  Type of Therapy:  Music Therapy  Participation Level:  Did Not Attend  Laurie Morton 03/16/2015, 3:20 PM 
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
## 2015-03-16 NOTE — Plan of Care (Signed)
Problem: Alteration in thought process Goal: STG-Patient is able to follow short directions Outcome: Progressing Patient is able to respond to simple commands and was able to hold a logical conversation with Clinical research associatewriter.

## 2015-03-16 NOTE — [facility] Group Notes (Signed)
[facility] Note   03/16/2015 10:11 AM  Participation Quality:  Active   Mood/Affect:  Appropriate  Depression Rating:  0  Anxiety Rating:  0  Thoughts of Suicide:  No Will you contract for safety?   NA  Current AVH:  No  Plan for Discharge/Comments:  Pt plans to return home and follow up with outpatient. However, she is considering staying with her son. She is worried that she will not be home for Christmas.   Transportation Means: family   Supports: Family   Donasia Wimes Ameren CorporationL Goro Wenrick MSW, [address]CSWA

## 2015-03-16 NOTE — Progress Notes (Signed)
D- Patient found in room upon approach.  Patient denies SI/ HI/AVH and pain. Contracts for safety during inpatient stay. Patient appears to be quite disorganized and she believes that her sons have told her that she will be discharged, even though she has not spoken to her sons at all today. Patient is wandering the halls, is anxious about possible discharge before the holidays. She denies feelings of depression.  A- Nurse provided reassurance and helped maintain a safe environment. Every 15 minute checks completed for safety. Provided scheduled medications.  R-  Patient compliant with treatment and is visible in the milieu. Safety maintained.
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
## 2015-03-17 LAB — GLUCOSE, CAPILLARY
Glucose-Capillary: 58 mg/dL — ABNORMAL LOW (ref 65–99)
Glucose-Capillary: 75 mg/dL (ref 65–99)
Glucose-Capillary: 147 mg/dL — ABNORMAL HIGH (ref 65–99)

## 2015-03-17 MED ORDER — CYANOCOBALAMIN 1000 MCG/ML IJ SOLN
1000.0000 ug | Freq: Once | INTRAMUSCULAR | Status: AC
  Administered 2015-03-17: 1000 ug via INTRAMUSCULAR
  Filled 2015-03-17: qty 1

## 2015-03-17 MED ORDER — CYANOCOBALAMIN 1000 MCG/ML IJ SOLN
1000.0000 ug | Freq: Once | INTRAMUSCULAR | Status: AC
  Administered 2015-03-18: 1000 ug via INTRAMUSCULAR
  Filled 2015-03-17: qty 1

## 2015-03-17 MED ORDER — CYANOCOBALAMIN 500 MCG PO TABS
1000.0000 ug | ORAL_TABLET | Freq: Every day | ORAL | Status: DC
  Administered 2015-03-20 – 2015-03-28 (×9): 1000 ug via ORAL
  Filled 2015-03-17 (×9): qty 2

## 2015-03-17 MED ORDER — CYANOCOBALAMIN 1000 MCG/ML IJ SOLN
1000.0000 ug | Freq: Once | INTRAMUSCULAR | Status: AC
  Administered 2015-03-19: 1000 ug via INTRAMUSCULAR
  Filled 2015-03-17: qty 1

## 2015-03-17 MED ORDER — SERTRALINE HCL 25 MG PO TABS
25.0000 mg | ORAL_TABLET | Freq: Every day | ORAL | Status: DC
  Administered 2015-03-17: 25 mg via ORAL
  Filled 2015-03-17: qty 1

## 2015-03-17 NOTE — BHH Group Notes (Signed)
[facility] Group Notes:  (Nursing/MHT/Case Management/Adjunct)  Date:  03/17/2015  Time:  8:56 AM  Type of Therapy:  Psychoeducational Skills  Participation Level:  Active  Participation Quality:  Appropriate  Affect:  Appropriate  Cognitive:  Appropriate  Insight:  Good  Engagement in Group:  Engaged and Supportive  Modes of Intervention:  Support  Summary of Progress/Problems:  Owens CorningJamila A Ilisha Blust 03/17/2015, 8:56 AM

## 2015-03-17 NOTE — Progress Notes (Signed)
Pt has been pleasant and cooperative. Pt has been med compliant. Pt denies SI and A/V hallucinations. Pt has been active on the unit. Pt continues to be psychotic , delusional and some what confused at times.  

## 2015-03-17 NOTE — Progress Notes (Signed)
Patient was cooperative with treatment, she seemed to have a good visit with her two sons, she asked to send her  Credit card home but after talking with Helmut MusterAlicia the NeurosurgeonAdministrative coordinator, we felt that this would not be a good idea due to the patient's mental state. She had no behavioral issues on shift and was pleasant on approach. She seems to be resting in bed quietly at this time.

## 2015-03-17 NOTE — Progress Notes (Signed)
[facility] MD Progress Note  03/17/2015 9:41 AM Laurie Morton  MRN:  [id number] Subjective:    The patient still having some paranoid and delusional thoughts that at home, there were men watching her take showers, taking pictures and video tape of her. This morning she was noted to have some visual hallucinations of seeing her son on the unit. The patient also feels that people are stealing from her. Her insight into admission in the hospital is very limited and the patient feels like she does not need to be here. She denies feeling depressed and denies any current active or passive suicidal thoughts. She denies any current auditory hallucinations. The patient has not been eating well. She skipped her breakfast this morning a blood sugar has been low. Metformin was held. The patient also had a low vitamin B 12 level and was started on B-12 injections. She says she was not eating well at home because she did not feel like she had money to buy groceries. So far, she has been compliant with Risperdal and Zoloft denies any physical adverse side effects associated with the medication. She has been compliant with medications on the unit and no behavioral disturbances were noted. She denies any somatic complaints. She has refused the MRI of the head due to delusional beliefs.   Principal Problem: Psychosis in elderly with behavioral disturbance Diagnosis:   Patient Active Problem List   Diagnosis Date Noted  . Diabetes (HCC) [E11.9] 03/14/2015  . Essential hypertension [I10] 03/14/2015  . Dyslipidemia [E78.5] 03/14/2015  . Hearing loss [H91.90] 03/14/2015  . GERD (gastroesophageal reflux disease) [K21.9] 03/14/2015  . Vitamin D deficiency [E55.9] 03/14/2015  . 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 She was prescribed fluoxetine but only took it for a brief period of time because you worsening her anxiety. She denies any history of psychiatric hospitalizations. All other diagnoses. History of suicidal attempts or history of self-injurious behaviors.  Past Medical History: Patient suffers from hypertension, diabetes, GERD and hypercholesterolemia. She had 2 recent cataract surgeries in 2011. She denies any history of seizures. She reported an episode of bleeding in her brain but is unclear as to whether this was a hematoma or an intracranial bleed.  Per [facility] records where she see Dr. Levora Dredge (family medicine): has a past medical history of CHEST PAIN (2012); Diabetes mellitus, type 2 (2005); Diverticulitis, ileum (2006); Elevated lipids (40981); Esophageal stricture; Essential hypertension; Gastroesophageal reflux disease; Hard of hearing; Hyperlipidemia; Hypertension (2005); Palpitations; and Thrush. Problem List: has Essential hypertension; Esophageal stricture; Hypertriglyceridemia; Retinal hemangioma; Primary hyperparathyroidism; OP (osteoporosis); Type 2 diabetes mellitus without complications; Hernia, hiatal; Esophagitis, reflux; and Exercise-induced shortness of breath on her problem list. Past Surgical History: has a past surgical history that includes Esophagogastroduodenoscopy (2011); Hysterectomy (1978); Tonsillectomy (1955); Abdominoplasty (2000); lens eye surgery (Left, 12/11/2008); lens eye surgery (Right, 07/23/2009); Colonoscopy (2006); Exc Ben Les Scalp/Hands/Ft/Genit; 3.1 To 4.0 Cm [phone]) (Right, 06/27/2014); and esophagogastrodoudenoscopy w/biopsy (07/19/2014).   Past Medical History  Diagnosis Date  . Diabetes mellitus without complication (HCC)   . Hypertension    History reviewed. No pertinent past surgical history.  Family History: History reviewed. No pertinent family history.  Family Psychiatric History: She  reports that her grandfather was an  alcoholic. There is no other family history of mental illness, substance abuse or suicide  Social History: Patient is currently divorced. She has 2 sons who are in their 50s. Patient lives alone in an apartment in [place]. She has been in the same house for 21/2 years. Her 2 children also live in [place]. 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 As far as her education she graduated from 12th grade. She is states she had trouble with reading comprehension all to school. She repeated the sixth grade one time. She denies any legal charges. History  Alcohol Use No    History  Drug Use Not on file    Social History   Social History  . Marital Status: Single    Spouse Name: N/A  . Number of Children: N/A  . Years of Education: N/A   Social History Main Topics  . Smoking status: Never Smoker   . Smokeless tobacco: None  . Alcohol Use: No  . Drug Use: None  . Sexual Activity: Not Asked   Other Topics Concern  . None   Social History Narrative    Allergies:  Allergies  Allergen Reactions  . Ambien [Zolpidem Tartrate] Other (See Comments)    hallucinations         Current Medications: Current Facility-Administered Medications  Medication Dose Route Frequency Provider Last Rate Last Dose  . acetaminophen (TYLENOL) tablet 650 mg  650 mg Oral Q6H PRN Jimmy Footman, MD      . alum & mag hydroxide-simeth (MAALOX/MYLANTA) 200-200-20 MG/5ML suspension 30 mL  30 mL Oral Q4H PRN Jimmy Footman, MD      . Melene Muller ON 03/18/2015] cyanocobalamin ((VITAMIN B-12)) injection 1,000 mcg  1,000 mcg Intramuscular Once Darliss Ridgel, MD      . cyanocobalamin ((VITAMIN B-12)) injection 1,000 mcg  1,000 mcg Intramuscular  Once Darliss Ridgel, MD      . Melene Muller ON 03/19/2015] cyanocobalamin ((VITAMIN B-12)) injection 1,000 mcg  1,000 mcg Intramuscular Once Darliss Ridgel, MD      . Melene Muller ON 03/20/2015] cyanocobalamin tablet 1,000 mcg  1,000 mcg Oral Daily Darliss Ridgel, MD      . feeding supplement (GLUCERNA SHAKE) (GLUCERNA SHAKE) liquid 237 mL  237 mL Oral TID BM Jimmy Footman, MD   237 mL at 03/17/15 0904  . fenofibrate tablet 54 mg  54 mg Oral Daily Jimmy Footman, MD   54 mg at 03/17/15 0900  . fluticasone (FLONASE) 50 MCG/ACT nasal spray 2 spray  2 spray Each Nare Daily Jimmy Footman, MD   2 spray at 03/17/15 0904  . gemfibrozil (LOPID) tablet 600 mg  600 mg Oral BID AC Jimmy Footman, MD   600 mg at 03/17/15 0901  . glimepiride (AMARYL) tablet 8 mg  8 mg Oral Q breakfast Brandy Hale, MD   8 mg at 03/16/15 0825  . LORazepam (ATIVAN) tablet 0.25 mg  0.25 mg Oral Q6H PRN Jimmy Footman, MD      . magnesium hydroxide (MILK OF MAGNESIA) suspension 30 mL  30 mL Oral Daily PRN Jimmy Footman, MD      . metFORMIN (GLUCOPHAGE-XR) 24 hr tablet 1,000 mg  1,000 mg Oral BID WC Brandy Hale, MD   1,000 mg at 03/16/15 1739  . pantoprazole (PROTONIX) EC tablet 40 mg  40 mg Oral Daily Jimmy Footman, MD   40 mg at 03/17/15 0901  . risperiDONE (RISPERDAL) tablet 2 mg  2 mg Oral QHS Jimmy Footman, MD      . sertraline (ZOLOFT) tablet 25 mg  25 mg Oral Daily Darliss Ridgel, MD   25 mg at 03/17/15 0901  . [START ON 03/20/2015] Vitamin D (Ergocalciferol) (DRISDOL) capsule 50,000 Units  50,000 Units Oral Weekly Brandy Hale, MD        Lab Results:  Results for orders placed or performed during the hospital encounter of 03/13/15 (from the past 48 hour(s))  Glucose, capillary     Status: None   Collection Time: 03/16/15  6:16 AM  Result Value Ref Range   Glucose-Capillary 75 65 - 99 mg/dL  Glucose, capillary     Status: None   Collection Time:  03/16/15  8:41 PM  Result Value Ref Range   Glucose-Capillary 70 65 - 99 mg/dL  Glucose, capillary     Status: Abnormal   Collection Time: 03/17/15  6:50 AM  Result Value Ref Range   Glucose-Capillary 58 (L) 65 - 99 mg/dL  Glucose, capillary     Status: None   Collection Time: 03/17/15  7:06 AM  Result Value Ref Range   Glucose-Capillary 75 65 - 99 mg/dL     Musculoskeletal: Strength & Muscle Tone: within normal limits Gait & Station: normal Patient leans: N/A  Psychiatric Specialty Exam: Review of Systems  Constitutional: Negative.  Negative for fever, chills and weight loss.  HENT: Negative.  Negative for hearing loss and tinnitus.   Eyes: Negative.  Negative for blurred vision, double vision, photophobia, pain and discharge.  Respiratory: Negative.  Negative for cough, hemoptysis, sputum production, shortness of breath and wheezing.   Cardiovascular: Negative.  Negative for chest pain, palpitations, orthopnea and claudication.  Gastrointestinal: Negative.  Negative for heartburn, nausea, vomiting, abdominal pain and diarrhea.  Genitourinary: Negative.  Negative for dysuria, urgency and frequency.  Musculoskeletal: Negative.  Negative for myalgias, back pain and neck pain.  Skin: Negative.  Negative for itching and rash.  Neurological: Negative.  Negative for dizziness, tingling, tremors, sensory change, focal weakness, seizures and headaches.  Endo/Heme/Allergies: Negative.  Does not bruise/bleed easily.  Psychiatric/Behavioral: Negative.     Blood pressure 169/76, pulse 92, temperature 98.7 F (37.1 C), temperature source Oral, resp. rate 19, height  (1.626 m), weight 54.885 kg (121 lb), SpO2 100 %.Body mass index is 20.76 kg/(m^2).  General Appearance: Well Groomed  Patent attorney::  Good  Speech:  Clear and Coherent  Volume:  Normal  Mood:  "I am fine"  Affect:  Anxious  Thought Process:  Linear  Orientation:  Full (Time, Place, and Person)  Thought Content:   Delusions  Suicidal Thoughts:  No  Homicidal Thoughts:  No  Memory:  Immediate;   Fair Recent;   Fair Remote;   Good  Judgement:  Poor  Insight:  Shallow  Psychomotor Activity:  Normal  Concentration:  Fair  Recall:  [employer] of Knowledge:Good  Language: Good  Akathisia:  No  Handed:    AIMS (if indicated):     Assets:  Architect Physical Health  ADL's:  Intact  Cognition: WNL  Sleep:  Number of Hours: 7.45   Treatment Plan Summary: Daily contact with patient to assess and evaluate symptoms and progress in treatment and Medication management   74 year old Caucasian female with no prior history of psychosis or delusions.  Presented to our emergency department with persecutory delusions and hallucinations.  Psychosis unspecified: All basic blood work including CBC, compressive metabolic panel, UA, TSH and urine toxicology screen were within normal limits. Patient was not intoxicated as her alcohol level was below the detection limit. Patient has been is started on low-dose of Risperdal. Continue Risperidol dose was increased to 2 mg by mouth daily at bedtime and will titrate up as needed. B-12 level was < 300 and will give B-12 injection as it may be contributing to problems with confusion and memory  History of depression and anxiety: Patient reports prior treatment with fluoxetine years ago for depression which she discontinued due to worsening anxiety. I will discontinue the fluoxetine and start her on sertraline.  Will continue Zoloft 25mg  po daily  Diabetes: Continue Amaryl 8 mg a day along with metformin 500 mg by mouth twice a day.  Possibly non compliant as HbA1c was 7.4  Hypertension: again hypotensive.  Lisinopril has been discontinued  Dyslipidemia: Patient will be treated with Lopid 600 mg by mouth twice a day and fenofibrate 54 mg a day.  GERD: continue will be treated with Protonix 40 mg a day  Vitamin D deficiency: Patient  will be continued on vitamin D 50,000 units  Poor oral intake: Will order glucerna tid and will push fluids orally.  Diet low sodium and car modified  Capillary blood sugar will be checked daily  Precautions every 15 minute checks  Labs: ammonia level, RPR, HIV,  all non reactive /wnl. HbA1c 7.4  Cholesterol and TG are elevated  Imaging: Taken to MRI but refused saying that the MRI machine was going to kill her or burn her  Collateral information will be obtained from the patient's son phone number [phone]. Apolinar JunesBrandon.  KAPUR,AARTI KAMAL 03/17/2015, 9:41 AM

## 2015-03-18 DIAGNOSIS — E538 Deficiency of other specified B group vitamins: Secondary | ICD-10-CM | POA: Diagnosis present

## 2015-03-18 LAB — GLUCOSE, CAPILLARY: Glucose-Capillary: 84 mg/dL (ref 65–99)

## 2015-03-18 MED ORDER — LOPERAMIDE HCL 2 MG PO CAPS
2.0000 mg | ORAL_CAPSULE | Freq: Three times a day (TID) | ORAL | Status: DC | PRN
  Administered 2015-03-18: 2 mg via ORAL
  Filled 2015-03-18: qty 1

## 2015-03-18 MED ORDER — RISPERIDONE 3 MG PO TABS
2.5000 mg | ORAL_TABLET | Freq: Every day | ORAL | Status: DC
  Administered 2015-03-18: 2.5 mg via ORAL
  Filled 2015-03-18: qty 1

## 2015-03-18 MED ORDER — CITALOPRAM HYDROBROMIDE 20 MG PO TABS
10.0000 mg | ORAL_TABLET | Freq: Every day | ORAL | Status: DC
  Administered 2015-03-18 – 2015-03-28 (×11): 10 mg via ORAL
  Filled 2015-03-18: qty 2
  Filled 2015-03-18 (×2): qty 1
  Filled 2015-03-18: qty 2
  Filled 2015-03-18 (×7): qty 1

## 2015-03-18 NOTE — Plan of Care (Signed)
Problem: Alteration in thought process Goal: LTG-Patient behavior demonstrates decreased signs psychosis (Patient behavior demonstrates decreased signs of psychosis to the point the patient is safe to return home and continue treatment in an outpatient setting.)  Outcome: Progressing Patient responding to internal stimuli.      

## 2015-03-18 NOTE — Progress Notes (Signed)
Pt has been pleasant and cooperative. Pt has been med compliant. Pt denies SI and A/V hallucinations. Pt has been active on the unit. Pt continues to be psychotic , delusional and some what confused at times.

## 2015-03-18 NOTE — Progress Notes (Addendum)
[facility] MD Progress Note  03/18/2015 9:13 AM Laurie Morton  MRN:  [id number] Subjective:    The patient remains pleasantly psychotic. She has been calm and cooperative compliant with medications but paranoid and delusional thoughts. She says that the people are still watching her in the shower. She has been noted by nursing to be talking to herself and having visual hallucinations of seeing her children on the unit. She does appear to be responding to internal stimuli. While she does appear to be confused at times as well, she was able to state that it was 03/18/15, Christmas. Appetite is fair. She denies any problems with sleep and nursing reported 5 hours of sleep last night. She has been having some explosive diarrhea all day yesterday which may be associated with the increased Zoloft dosage. The patient continues to have limited insight into psychosis. BS are controlled and VSS. She has refused the MRI of the head due to delusional beliefs.   Principal Problem: Psychosis in elderly with behavioral disturbance Diagnosis:   Patient Active Problem List   Diagnosis Date Noted  . Diabetes (HCC) [E11.9] 03/14/2015  . Essential hypertension [I10] 03/14/2015  . Dyslipidemia [E78.5] 03/14/2015  . Hearing loss [H91.90] 03/14/2015  . GERD (gastroesophageal reflux disease) [K21.9] 03/14/2015  . Vitamin D deficiency [E55.9] 03/14/2015  . 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 She was prescribed fluoxetine but only took it for a brief period of time because you worsening her anxiety. She denies any history of psychiatric hospitalizations. All other diagnoses. History of suicidal attempts or history of self-injurious behaviors.  Past Medical History:  Patient suffers from hypertension, diabetes, GERD and hypercholesterolemia. She had 2 recent cataract surgeries in 2011. She denies any history of seizures. She reported an episode of bleeding in her brain but is unclear as to whether this was a hematoma or an intracranial bleed.  Per [facility] records where she see Dr. Levora Dredge (family medicine): has a past medical history of CHEST PAIN (2012); Diabetes mellitus, type 2 (2005); Diverticulitis, ileum (2006); Elevated lipids (78295); Esophageal stricture; Essential hypertension; Gastroesophageal reflux disease; Hard of hearing; Hyperlipidemia; Hypertension (2005); Palpitations; and Thrush. Problem List: has Essential hypertension; Esophageal stricture; Hypertriglyceridemia; Retinal hemangioma; Primary hyperparathyroidism; OP (osteoporosis); Type 2 diabetes mellitus without complications; Hernia, hiatal; Esophagitis, reflux; and Exercise-induced shortness of breath on her problem list. Past Surgical History: has a past surgical history that includes Esophagogastroduodenoscopy (2011); Hysterectomy (1978); Tonsillectomy (1955); Abdominoplasty (2000); lens eye surgery (Left, 12/11/2008); lens eye surgery (Right, 07/23/2009); Colonoscopy (2006); Exc Ben Les Scalp/Hands/Ft/Genit; 3.1 To 4.0 Cm [phone]) (Right, 06/27/2014); and esophagogastrodoudenoscopy w/biopsy (07/19/2014).   Past Medical History  Diagnosis Date  . Diabetes mellitus without complication (HCC)   . Hypertension    History reviewed. No pertinent past surgical history.  Family History: History reviewed. No pertinent family history.  Family Psychiatric History: She reports that her grandfather was an alcoholic. There is no other family history of mental illness, substance abuse or suicide  Social History: Patient is currently divorced. She has 2 sons who are in their 31s. Patient lives alone in an apartment in [place]. She has been in the same house for 21/2 years. Her 2 children  also live in [place]. 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 Patient retired from her job there at the age of 46. As far as her education she graduated from 12th grade. She is states she had trouble with reading comprehension all to school. She repeated the sixth grade one time. She denies any legal charges. History  Alcohol Use No    History  Drug Use Not on file    Social History   Social History  . Marital Status: Single    Spouse Name: N/A  . Number of Children: N/A  . Years of Education: N/A   Social History Main Topics  . Smoking status: Never Smoker   . Smokeless tobacco: None  . Alcohol Use: No  . Drug Use: None  . Sexual Activity: Not Asked   Other Topics Concern  . None   Social History Narrative    Allergies:  Allergies  Allergen Reactions  . Ambien [Zolpidem Tartrate] Other (See Comments)    hallucinations         Current Medications: Current Facility-Administered Medications  Medication Dose Route Frequency Provider Last Rate Last Dose  . acetaminophen (TYLENOL) tablet 650 mg  650 mg Oral Q6H PRN Jimmy Footman, MD      . alum & mag hydroxide-simeth (MAALOX/MYLANTA) 200-200-20 MG/5ML suspension 30 mL  30 mL Oral Q4H PRN Jimmy Footman, MD      . citalopram (CELEXA) tablet 10 mg  10 mg Oral Daily Darliss Ridgel, MD      . cyanocobalamin ((VITAMIN B-12)) injection 1,000 mcg  1,000 mcg Intramuscular Once Darliss Ridgel, MD      . Melene Muller ON 03/19/2015] cyanocobalamin ((VITAMIN B-12)) injection 1,000 mcg  1,000 mcg Intramuscular Once Darliss Ridgel, MD      . Melene Muller ON 03/20/2015] cyanocobalamin tablet 1,000 mcg  1,000 mcg Oral Daily Darliss Ridgel, MD      . feeding supplement (GLUCERNA SHAKE) (GLUCERNA SHAKE) liquid 237 mL  237 mL Oral TID BM Jimmy Footman, MD   237 mL at 03/17/15 2100  . fenofibrate tablet 54 mg  54 mg Oral Daily Jimmy Footman, MD   54 mg at 03/17/15 0900  . fluticasone (FLONASE) 50 MCG/ACT nasal spray 2 spray  2 spray Each Nare Daily Jimmy Footman, MD   2 spray at 03/17/15 0904  . gemfibrozil (LOPID) tablet 600 mg  600 mg Oral BID AC Jimmy Footman, MD   600 mg at 03/17/15 1612  . glimepiride (AMARYL) tablet 8 mg  8 mg Oral Q breakfast Brandy Hale, MD   8 mg at 03/16/15 0825  . loperamide (IMODIUM) capsule 2 mg  2 mg Oral TID PRN Darliss Ridgel, MD      . LORazepam (ATIVAN) tablet 0.25 mg  0.25 mg Oral Q6H PRN Jimmy Footman, MD      . magnesium hydroxide (MILK OF MAGNESIA) suspension 30 mL  30 mL Oral Daily PRN Jimmy Footman, MD      . metFORMIN (GLUCOPHAGE-XR) 24 hr tablet 1,000 mg  1,000 mg Oral BID WC Brandy Hale, MD   1,000 mg at 03/17/15 1612  . pantoprazole (PROTONIX) EC tablet 40 mg  40 mg Oral Daily Jimmy Footman, MD   40 mg at 03/17/15 0901  . risperiDONE (RISPERDAL) tablet 2.5 mg  2.5 mg Oral QHS Darliss Ridgel, MD      . Melene Muller ON 03/20/2015] Vitamin D (Ergocalciferol) (  DRISDOL) capsule 50,000 Units  50,000 Units Oral Weekly Brandy HaleUzma Faheem, MD        Lab Results:  Results for orders placed or performed during the hospital encounter of 03/13/15 (from the past 48 hour(s))  Glucose, capillary     Status: None   Collection Time: 03/16/15  8:41 PM  Result Value Ref Range   Glucose-Capillary 70 65 - 99 mg/dL  Glucose, capillary     Status: Abnormal   Collection Time: 03/17/15  6:50 AM  Result Value Ref Range   Glucose-Capillary 58 (L) 65 - 99 mg/dL  Glucose, capillary     Status: None   Collection Time: 03/17/15  7:06 AM  Result Value Ref Range   Glucose-Capillary 75 65 - 99 mg/dL  Glucose, capillary     Status: Abnormal   Collection Time: 03/17/15  4:06 PM  Result Value Ref Range   Glucose-Capillary 147 (H) 65 - 99 mg/dL    Comment 1 Notify RN   Glucose, capillary     Status: None   Collection Time: 03/18/15  6:59 AM  Result Value Ref Range   Glucose-Capillary 84 65 - 99 mg/dL   Comment 1 Notify RN      Musculoskeletal: Strength & Muscle Tone: within normal limits Gait & Station: normal Patient leans: N/A  Psychiatric Specialty Exam: Review of Systems  Constitutional: Negative.  Negative for fever, chills and weight loss.  HENT: Negative.  Negative for hearing loss and tinnitus.   Eyes: Negative.  Negative for blurred vision, double vision, photophobia, pain and discharge.  Respiratory: Negative.  Negative for cough, hemoptysis, sputum production, shortness of breath and wheezing.   Cardiovascular: Negative.  Negative for chest pain, palpitations, orthopnea and claudication.  Gastrointestinal: Positive for diarrhea. Negative for heartburn, nausea, vomiting, abdominal pain and constipation.       She had explosive diarrhea all day yesterday and this morning.  Genitourinary: Negative.  Negative for dysuria, urgency and frequency.  Musculoskeletal: Negative.  Negative for myalgias, back pain and neck pain.  Skin: Negative.  Negative for itching and rash.  Neurological: Negative.  Negative for dizziness, tingling, tremors, sensory change, focal weakness, seizures and headaches.  Endo/Heme/Allergies: Negative.  Does not bruise/bleed easily.  Psychiatric/Behavioral: Negative.     Blood pressure 113/68, pulse 93, temperature 97.6 F (36.4 C), temperature source Oral, resp. rate 19, height 5\' 4"  (1.626 m), weight 54.885 kg (121 lb), SpO2 100 %.Body mass index is 20.76 kg/(m^2).  General Appearance: Well Groomed  Patent attorneyye Contact::  Good  Speech:  Clear and Coherent  Volume:  Normal  Mood:  "I am fine"  Affect:  Calm and pleasant  Thought Process:  Linear  Orientation:  Full (Time, Place, and Person)  Thought Content:  Delusions and Paranoid Ideation  Suicidal Thoughts:  No  Homicidal Thoughts:  No  Memory:   Immediate;   Fair Recent;   Fair Remote;   Good  Judgement:  Poor  Insight:  Shallow  Psychomotor Activity:  Normal  Concentration:  Fair  Recall:  [employer]Fair  Fund of Knowledge:Good  Language: Good  Akathisia:  No  Handed:    AIMS (if indicated):     Assets:  ArchitectCommunication Skills Financial Resources/Insurance Physical Health  ADL's:  Intact  Cognition: WNL  Sleep:  Number of Hours: 5   Treatment Plan Summary: Daily contact with patient to assess and evaluate symptoms and progress in treatment and Medication management   74 year old Caucasian female with no prior history of  psychosis or delusions. Presented to our emergency department with persecutory delusions and hallucinations.  Psychosis unspecified: All basic blood work including CBC, compressive metabolic panel, UA, TSH and urine toxicology screen were within normal limits. Patient was not intoxicated as her alcohol level was below the detection limit. Patient has been is started on low-dose of Risperdal and will increase her Risperdal from 2 mg by mouth nightly to 2.5 mg.. B-12 level was < 300 and will give B-12 injection as it may be contributing to problems with confusion and memory. She will get 3 days of B12 injections and then po daily  History of depression and anxiety: The patient was switched from Prozac to Zoloft but had problems with explosive diarrhea, Zoloft. Will discontinue Zoloft and start Celexa 10 mg by mouth daily with a plan to titrate up as tolerated and needed.  Dyslipidemia: Patient will be treated with Lopid 600 mg by mouth twice a day and fenofibrate 54 mg a day.  GERD: continue will be treated with Protonix 40 mg a day  Vitamin D deficiency: Patient will be continued on vitamin D 50,000 units  B12 Deficiency : Vitamin B12 level was less than 300. Will give a vitamin B12 injections 3 days and then 1000 MCG by mouth daily.   Poor oral intake: She gas glucerna tid and will push fluids  orally.  Diet low sodium and car modified  Capillary blood sugar will be checked daily  Precautions every 15 minute checks  Labs: ammonia level, RPR, HIV,  all non reactive /wnl. HbA1c 7.4  Cholesterol and TG are elevated  Imaging: Taken to MRI but refused saying that the MRI machine was going to kill her or burn her. Will retry MRI Head  Collateral information will be obtained from the patient's son phone number [phone]. Apolinar Junes.  Makhayla Mcmurry KAMAL 03/18/2015, 9:13 AM

## 2015-03-18 NOTE — Progress Notes (Signed)
D: Pt denies SI/HI/AVH. Pt is pleasant and cooperative, affect is flat and sad but brightens on approach, patient noted responding to internal stimuli.  A: Pt was offered support and encouragement. Pt was given scheduled medications. Pt was encouraged to attend groups. Q 15 minute checks were done for safety.  R:Pt did not attend group. Pt is taking medication. Pt has no complaints.Pt receptive to treatment and safety maintained on unit.

## 2015-03-18 NOTE — [facility] Group Notes (Addendum)
[facility] Group Notes:  (Nursing/MHT/Case Management/Adjunct)  Date:  03/18/2015  Time:  9:57 PM  Type of Therapy: Holiday Wrap-up Discussion  Participation Level:  Did Not Attend  Participation Quality:  Did Not Attend  Affect:  N/A  Cognitive:  N/A  Insight:  None  Engagement in Group:  Did Not Attend  Modes of Intervention:  Discussion  Summary of Progress/Problems:  Tomasita MorrowChelsea Nanta Teya Otterson 03/18/2015, 9:57 PM

## 2015-03-19 LAB — GLUCOSE, CAPILLARY
Glucose-Capillary: 58 mg/dL — ABNORMAL LOW (ref 65–99)
Glucose-Capillary: 58 mg/dL — ABNORMAL LOW (ref 65–99)
Glucose-Capillary: 139 mg/dL — ABNORMAL HIGH (ref 65–99)

## 2015-03-19 MED ORDER — METFORMIN HCL ER 500 MG PO TB24
500.0000 mg | ORAL_TABLET | Freq: Two times a day (BID) | ORAL | Status: DC
  Administered 2015-03-19 – 2015-03-20 (×2): 500 mg via ORAL
  Filled 2015-03-19 (×3): qty 1

## 2015-03-19 MED ORDER — RISPERIDONE 3 MG PO TABS
3.0000 mg | ORAL_TABLET | Freq: Every day | ORAL | Status: DC
  Administered 2015-03-19 – 2015-03-27 (×9): 3 mg via ORAL
  Filled 2015-03-19 (×9): qty 1

## 2015-03-19 NOTE — Progress Notes (Signed)
Recreation Therapy Notes  Date: 12.26.16 Time: 3:10 pm Location: Craft Room  Group Topic: Self-expression  Goal Area(s) Addresses:  Patient will identify one color per emotion listed on wheel. Patient will verbalize benefit of using art as a means of self-expression. Patient will verbalize one emotion experienced during session. Patient will be educated on other forms of self-expression.  Behavioral Response: Did not attend  Intervention: Emotion Wheel  Activity: Patients were given a worksheet with 7 different emotions. Patients were instructed to pick a color for each emotion.  Education: LRT educated patients on other forms of self-expression.  Education Outcome: Patient did not attend group.   Clinical Observations/Feedback: Patient did not attend group.  Jacquelynn CreeGreene,Dewight Catino M, LRT/CTRS 03/19/2015 4:27 PM

## 2015-03-19 NOTE — [facility] Group Notes (Signed)
[facility] Group Notes:  (Nursing/MHT/Case Management/Adjunct)  Date:  03/19/2015  Time:  12:36 PM  Type of Therapy:  Psychoeducational Skills  Participation Level:  Minimal  Participation Quality:  Appropriate  Affect:  Appropriate  Cognitive:  Appropriate  Insight:  Improving  Engagement in Group:  Supportive  Modes of Intervention:  Education and Support  Isabelle CourseWhitney R Kamarri Fischetti 03/19/2015, 12:36 PM

## 2015-03-19 NOTE — Progress Notes (Signed)
[facility] MD Progress Note  03/19/2015 1:05 PM Laurie Morton  MRN:  [id number]  Subjective:  Laurie Morton feels slightly better today. She is less paranoid and feels safe on the unit is terribly worried about her finances believing that someone has access to her bank account and spending her money. She worries that there is no money to pay having an in January. She does not want with her son as she feels that her daughter-in-law does not like her. There were no visitors on Christmas Day. She denies somatic symptoms and apparently diarrhea has resolved. Her appetite is still poor. She tries to participate in programming.  Principal Problem: Psychosis in elderly with behavioral disturbance Diagnosis:   Patient Active Problem List   Diagnosis Date Noted  . B12 deficiency [E53.8] 03/18/2015  . Diabetes (HCC) [E11.9] 03/14/2015  . Essential hypertension [I10] 03/14/2015  . Dyslipidemia [E78.5] 03/14/2015  . Hearing loss [H91.90] 03/14/2015  . GERD (gastroesophageal reflux disease) [K21.9] 03/14/2015  . Vitamin D deficiency [E55.9] 03/14/2015  . 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  Past Medical History:  Past Medical History  Diagnosis Date  . Diabetes mellitus without complication (HCC)   . Hypertension    History reviewed. No pertinent past surgical history. Family History: History reviewed. No pertinent family history. Family Psychiatric  History: See H&P. Social History:  History  Alcohol Use No     History  Drug Use Not on file    Social History   Social History  . Marital Status: Single    Spouse Name: N/A  . Number of Children: N/A  . Years of Education: N/A   Social History Main Topics  . Smoking status: Never Smoker   . Smokeless tobacco: None  . Alcohol Use: No  . Drug Use: None  . Sexual Activity: Not Asked   Other Topics Concern  . None   Social  History Narrative   Additional Social History:                         Sleep: Fair  Appetite:  Poor  Current Medications: Current Facility-Administered Medications  Medication Dose Route Frequency Provider Last Rate Last Dose  . acetaminophen (TYLENOL) tablet 650 mg  650 mg Oral Q6H PRN Jimmy Footman, MD      . alum & mag hydroxide-simeth (MAALOX/MYLANTA) 200-200-20 MG/5ML suspension 30 mL  30 mL Oral Q4H PRN Jimmy Footman, MD      . citalopram (CELEXA) tablet 10 mg  10 mg Oral Daily Darliss Ridgel, MD   10 mg at 03/19/15 1013  . [START ON 03/20/2015] cyanocobalamin tablet 1,000 mcg  1,000 mcg Oral Daily Darliss Ridgel, MD      . feeding supplement (GLUCERNA SHAKE) (GLUCERNA SHAKE) liquid 237 mL  237 mL Oral TID BM Jimmy Footman, MD   237 mL at 03/19/15 1000  . fenofibrate tablet 54 mg  54 mg Oral Daily Jimmy Footman, MD   54 mg at 03/19/15 1013  . fluticasone (FLONASE) 50 MCG/ACT nasal spray 2 spray  2 spray Each Nare Daily Jimmy Footman, MD   2 spray at 03/19/15 1013  . gemfibrozil (LOPID) tablet 600 mg  600 mg Oral BID AC Jimmy Footman, MD   600 mg at 03/19/15 0804  . glimepiride (AMARYL) tablet 8 mg  8 mg Oral Q breakfast Brandy Hale, MD  8 mg at 03/19/15 0804  . loperamide (IMODIUM) capsule 2 mg  2 mg Oral TID PRN Darliss RidgelAarti K Kapur, MD   2 mg at 03/18/15 1001  . LORazepam (ATIVAN) tablet 0.25 mg  0.25 mg Oral Q6H PRN Jimmy FootmanAndrea Hernandez-Gonzalez, MD      . magnesium hydroxide (MILK OF MAGNESIA) suspension 30 mL  30 mL Oral Daily PRN Jimmy FootmanAndrea Hernandez-Gonzalez, MD      . metFORMIN (GLUCOPHAGE-XR) 24 hr tablet 1,000 mg  1,000 mg Oral BID WC Brandy HaleUzma Faheem, MD   1,000 mg at 03/19/15 0804  . pantoprazole (PROTONIX) EC tablet 40 mg  40 mg Oral Daily Jimmy FootmanAndrea Hernandez-Gonzalez, MD   40 mg at 03/19/15 1013  . risperiDONE (RISPERDAL) tablet 2.5 mg  2.5 mg Oral QHS Darliss RidgelAarti K Kapur, MD   2.5 mg at 03/18/15 2146  . [START ON  03/20/2015] Vitamin D (Ergocalciferol) (DRISDOL) capsule 50,000 Units  50,000 Units Oral Weekly Brandy HaleUzma Faheem, MD        Lab Results:  Results for orders placed or performed during the hospital encounter of 03/13/15 (from the past 48 hour(s))  Glucose, capillary     Status: Abnormal   Collection Time: 03/17/15  4:06 PM  Result Value Ref Range   Glucose-Capillary 147 (H) 65 - 99 mg/dL   Comment 1 Notify RN   Glucose, capillary     Status: None   Collection Time: 03/18/15  6:59 AM  Result Value Ref Range   Glucose-Capillary 84 65 - 99 mg/dL   Comment 1 Notify RN   Glucose, capillary     Status: Abnormal   Collection Time: 03/19/15  6:40 AM  Result Value Ref Range   Glucose-Capillary 58 (L) 65 - 99 mg/dL  Glucose, capillary     Status: Abnormal   Collection Time: 03/19/15  6:57 AM  Result Value Ref Range   Glucose-Capillary 58 (L) 65 - 99 mg/dL  Glucose, capillary     Status: Abnormal   Collection Time: 03/19/15  8:02 AM  Result Value Ref Range   Glucose-Capillary 139 (H) 65 - 99 mg/dL    Physical Findings: AIMS:  , ,  ,  ,    CIWA:    COWS:     Musculoskeletal: Strength & Muscle Tone: within normal limits Gait & Station: normal Patient leans: N/A  Psychiatric Specialty Exam: Review of Systems  Gastrointestinal: Positive for diarrhea.  All other systems reviewed and are negative.   Blood pressure 123/49, pulse 81, temperature 97.8 F (36.6 C), temperature source Oral, resp. rate 16, height 5\' 4"  (1.626 m), weight 54.885 kg (121 lb), SpO2 100 %.Body mass index is 20.76 kg/(m^2).  General Appearance: Casual  Eye Contact::  Good  Speech:  Clear and Coherent  Volume:  Normal  Mood:  Anxious  Affect:  Appropriate  Thought Process:  Disorganized  Orientation:  Full (Time, Place, and Person)  Thought Content:  Delusions and Paranoid Ideation  Suicidal Thoughts:  No  Homicidal Thoughts:  No  Memory:  Immediate;   Fair Recent;   Fair Remote;   Fair  Judgement:  Impaired   Insight:  Shallow  Psychomotor Activity:  Normal  Concentration:  Fair  Recall:  FiservFair  Fund of Knowledge:Fair  Language: Fair  Akathisia:  No  Handed:  Right  AIMS (if indicated):     Assets:  Communication Skills Desire for Improvement Financial Resources/Insurance Housing Physical Health Resilience Social Support  ADL's:  Intact  Cognition: WNL  Sleep:  Number of  Hours: 7.15   Treatment Plan Summary: Daily contact with patient to assess and evaluate symptoms and progress in treatment and Medication management   Laurie Morton is a 74 year old Caucasian female with no prior psychiatry history who presented to our emergency department with persecutory delusions and hallucinations.  1. Psychosis unspecified: All basic blood work including CBC, compressive metabolic panel, UA, TSH and urine toxicology screen were within normal limits. Patient was not intoxicated as her alcohol level was below the detection limit. Patient has been is started on Risperdal. I will increase it today to 3 mg.   2. B12 deficiency. B-12 level was < 300 and will give B-12 injection as it may be contributing to problems with confusion and memory. She will get 3 days of B12 injections and then po daily  3. History of depression and anxiety: The patient was switched from Prozac to Zoloft but had problems with explosive diarrhea on the Zoloft. We switched to Celexa 10 mg daily with a plan to titrate up as tolerated and needed.  4. Dyslipidemia: Patient is treated with Lopid 600 mg by mouth twice a day and fenofibrate 54 mg a day.  5. GERD: We continued Protonix 40 mg a day  6. Vitamin D deficiency: Patient will be continued on vitamin D 50,000 units  7. Poor oral intake: She gas glucerna tid and will push fluids orally.  8. Diabetes. We'll continue metformin and Amaryl along with ADA Diet and blood glucose monitoring. Her blood sugar was 56 today. We will lower metformin to 500 mg while in the  hospital.   9. Diarrhea. She has been moderately available. Her diarrhea is thought to be from Zoloft but the patient believes is from cholesterol lowering drugs. It could also be from metformin. Apparently the patient has not been taking all her medications at home as prescribed   10. Metabolic syndrome. Cholesterol and TG are elevated,  HbA1c 7.4.  11. Labs: ammonia level, RPR, HIV, all non reactive /wnl.  12. Imaging: Taken to MRI but refused saying that the MRI machine was going to kill her or burn her. Will retry MRI Head  13. Disposition. She will likely return to her apartment although there is a plan to stay with her son. Collateral information was obtained from the patient's son phone number [phone]. Laurie Morton 03/19/2015, 1:05 PM

## 2015-03-19 NOTE — Plan of Care (Signed)
Problem: Ineffective individual coping Goal: STG: Patient will remain free from self harm Outcome: Progressing Pt remains free from self harm, will continue to monitor closely for safety.

## 2015-03-19 NOTE — Plan of Care (Signed)
Problem: Alteration in thought process Goal: LTG-Patient has not harmed self or others in at least 2 days Outcome: Progressing Patient has remained free from harm since admission     

## 2015-03-19 NOTE — [facility] Group Notes (Signed)
[facility] LCSW Group Therapy   03/19/2015 1:15 p  Type of Therapy: Group Therapy   Participation Level: Active   Participation Quality: Attentive, Sharing and Supportive   Affect: Appropriate  Cognitive: Alert and Oriented   Insight: Developing/Improving and Engaged   Engagement in Therapy: Developing/Improving and Engaged   Modes of Intervention: Clarification, Confrontation, Discussion, Education, Exploration,  Limit-setting, Orientation, Problem-solving, Rapport Building, Dance movement psychotherapisteality Testing, Socialization and Support   Summary of Progress/Problems: Pt identified obstacles faced currently and processed barriers involved in overcoming these obstacles. Pt identified steps necessary for overcoming these obstacles and explored motivation (internal and external) for facing these difficulties head on. Pt further identified one area of concern in their lives and chose a goal to focus on for today. Pt did not share at length, but did share with the group her technique of calming herself in order to achieve her goals of assisting her grandchildren in learning new skills and paying for their schooling and listed meditating on the sky for an extended period of time as that calming technique.  Pt was polite and cooperative with the CSW and other group members and focused and attentive to the topics discussed and the sharing of others, although at times she presented as confused.   Laurie PeaJonathan F. Caydence Enck, LCSWA, LCAS  /16

## 2015-03-19 NOTE — Progress Notes (Signed)
She is calm & cooperative.Denies depression & suicidal ideation or AV hallucination.Visible in the milieu.Attended groups.Compliant with medications.

## 2015-03-19 NOTE — Progress Notes (Signed)
D: Patient affect bright on approach. She continues to isolate to her room. She denies SI/HI/AVH. She states she has a pain in her stomach at a 3. She refuses anything for it.  A: Medication is given with education. Encouragement was provided. Ensure was given as a supplement. R: Patient was compliant with medication and Ensure. Patient has remained pleasant. Safety maintained with 15 min checks.

## 2015-03-19 NOTE — [facility] Group Notes (Signed)
[facility] LCSW Aftercare Discharge Planning Group Note  03/19/2015 9:15 AM  Participation Quality: Did Not Attend. Patient invited to participate but declined.   Shaunna Rosetti F. Izabell Schalk, MSW, LCSWA, LCAS   

## 2015-03-20 LAB — GLUCOSE, CAPILLARY
Glucose-Capillary: 45 mg/dL — ABNORMAL LOW (ref 65–99)
Glucose-Capillary: 64 mg/dL — ABNORMAL LOW (ref 65–99)
Glucose-Capillary: 214 mg/dL — ABNORMAL HIGH (ref 65–99)
Glucose-Capillary: 223 mg/dL — ABNORMAL HIGH (ref 65–99)

## 2015-03-20 NOTE — Care Management Utilization Note (Signed)
   Per State Regulation 482.30  This chart was reviewed for necessity with respect to the patient's Admission/ Duration of stay.  Next review date: 03/24/15  Nicolasa Duckingrystal Morrison RN, BSN

## 2015-03-20 NOTE — Progress Notes (Signed)
D: Patient wandering around on unity . Questioning staff if any one has seen her sons  . Confused this shift . Questioning her medications . Periods of Lucidity noted .Stated appetite fair and energy level  Is normal. Concentration is poor  . Noted to respond to enteral .  Voice  No pain concerns . Appropriate ADL'S.  Limited Interacting with peers and staff.  A: Encourage patient participation with unit programming . Instruction  Given on  Medication , verbalize understanding. R: Voice no other concerns. Staff continue to monitor

## 2015-03-20 NOTE — [facility] Group Notes (Signed)
[facility] LCSW Group Therapy   03/20/2015 11:00 am  Type of Therapy: Group Therapy   Participation Level: Active   Participation Quality: Attentive, Sharing and Supportive   Affect: Appropriate  Cognitive: Alert and Oriented   Insight: Developing/Improving and Engaged   Engagement in Therapy: Developing/Improving and Engaged   Modes of Intervention: Clarification, Confrontation, Discussion, Education, Exploration,  Limit-setting, Orientation, Problem-solving, Rapport Building, Dance movement psychotherapisteality Testing, Socialization and Support  Summary of Progress/Problems: The topic for group therapy was feelings about diagnosis. Pt actively participated in group discussion on their past and current diagnosis and how they feel towards this. Pt also identified how society and family members judge them, based on their diagnosis as well as stereotypes and stigmas. Pt shared with the group that she didn't feel that she was fully informed as to her diagnosis, but that when she feels hopeless she knows she is suffering and that feeling depressed is the eventual result.  Pt shared with the group she employs resting and taking a nap as her primary strategy for recovery when she feels depressed.  Pt seemed confused at times, but was polite and cooperative with the CSW and other group members and focused and attentive to the topics discussed and the sharing of others.     Dorothe PeaJonathan F. Nakshatra Klose, LCSWA, LCAS

## 2015-03-20 NOTE — Progress Notes (Signed)
Recreation Therapy Notes  Date: 12.27.16 Time: 3:00 pm Location: Craft Room  Group Topic: Goal Setting  Goal Area(s) Addresses:  Patient will write at least one goal. Patient will write at least one obstacle.  Behavioral Response: Attentive   Intervention: Recovery Goal Chart  Activity: Patients were instructed to make a Recovery Goal Chart including 3 goals towards their recovery, obstacles, the day they started working on their goals, and the day they achieved their goals.  Education: LRT educated patients on healthy ways they can celebrate reaching their goals.  Education Outcome: In group clarification offered  Clinical Observations/Feedback: Patient worked on activity. Patient left group at approximately 3:18 pm. Patient returned to group at approximately 3:31 pm. Patient did not contributed to group discussion.  Jacquelynn CreeGreene,Terrianne Cavness M, LRT/CTRS 03/20/2015 4:34 PM

## 2015-03-20 NOTE — Progress Notes (Signed)
[facility] MD Progress Note  03/20/2015 1:51 PM Laurie Morton  MRN:  [id number]  Subjective:  Laurie Morton is actively confused today. She believes that her sons are coming to pick her up. She is in despair as she was unable to find them. She denies any symptoms of depression, anxiety, or psychosis. She tolerates medications well. She participates in programming.  Principal Problem: Psychosis in elderly with behavioral disturbance Diagnosis:   Patient Active Problem List   Diagnosis Date Noted  . B12 deficiency [E53.8] 03/18/2015  . Diabetes (HCC) [E11.9] 03/14/2015  . Essential hypertension [I10] 03/14/2015  . Dyslipidemia [E78.5] 03/14/2015  . Hearing loss [H91.90] 03/14/2015  . GERD (gastroesophageal reflux disease) [K21.9] 03/14/2015  . Vitamin D deficiency [E55.9] 03/14/2015  . Psychosis in elderly with behavioral disturbance [F03.91] 03/14/2015   Total Time spent with patient: 15 minutes  Past Psychiatric History: Psychosis.  Past Medical History:  Past Medical History  Diagnosis Date  . Diabetes mellitus without complication (HCC)   . Hypertension    History reviewed. No pertinent past surgical history. Family History: History reviewed. No pertinent family history. Family Psychiatric  History: See H&P.   Social History:  History  Alcohol Use No     History  Drug Use Not on file    Social History   Social History  . Marital Status: Single    Spouse Name: N/A  . Number of Children: N/A  . Years of Education: N/A   Social History Main Topics  . Smoking status: Never Smoker   . Smokeless tobacco: None  . Alcohol Use: No  . Drug Use: None  . Sexual Activity: Not Asked   Other Topics Concern  . None   Social History Narrative   Additional Social History:                         Sleep: Fair  Appetite:  Fair  Current Medications: Current Facility-Administered Medications  Medication Dose Route Frequency Provider Last Rate Last Dose  .  acetaminophen (TYLENOL) tablet 650 mg  650 mg Oral Q6H PRN Jimmy Footman, MD      . alum & mag hydroxide-simeth (MAALOX/MYLANTA) 200-200-20 MG/5ML suspension 30 mL  30 mL Oral Q4H PRN Jimmy Footman, MD      . citalopram (CELEXA) tablet 10 mg  10 mg Oral Daily Darliss Ridgel, MD   10 mg at 03/20/15 1006  . cyanocobalamin tablet 1,000 mcg  1,000 mcg Oral Daily Darliss Ridgel, MD   1,000 mcg at 03/20/15 1005  . feeding supplement (GLUCERNA SHAKE) (GLUCERNA SHAKE) liquid 237 mL  237 mL Oral TID BM Jimmy Footman, MD   237 mL at 03/19/15 2000  . fenofibrate tablet 54 mg  54 mg Oral Daily Jimmy Footman, MD   54 mg at 03/20/15 1006  . fluticasone (FLONASE) 50 MCG/ACT nasal spray 2 spray  2 spray Each Nare Daily Jimmy Footman, MD   2 spray at 03/20/15 1005  . gemfibrozil (LOPID) tablet 600 mg  600 mg Oral BID AC Jimmy Footman, MD   600 mg at 03/20/15 0802  . glimepiride (AMARYL) tablet 8 mg  8 mg Oral Q breakfast Brandy Hale, MD   8 mg at 03/19/15 0804  . loperamide (IMODIUM) capsule 2 mg  2 mg Oral TID PRN Darliss Ridgel, MD   2 mg at 03/18/15 1001  . LORazepam (ATIVAN) tablet 0.25 mg  0.25 mg Oral Q6H PRN Jimmy Footman, MD      .  magnesium hydroxide (MILK OF MAGNESIA) suspension 30 mL  30 mL Oral Daily PRN Jimmy Footman, MD      . metFORMIN (GLUCOPHAGE-XR) 24 hr tablet 500 mg  500 mg Oral BID WC Zophia Marrone B Marlaysia Lenig, MD   500 mg at 03/19/15 1750  . pantoprazole (PROTONIX) EC tablet 40 mg  40 mg Oral Daily Jimmy Footman, MD   40 mg at 03/20/15 1006  . risperiDONE (RISPERDAL) tablet 3 mg  3 mg Oral QHS Shari Prows, MD   3 mg at 03/19/15 2117  . Vitamin D (Ergocalciferol) (DRISDOL) capsule 50,000 Units  50,000 Units Oral Weekly Brandy Hale, MD   50,000 Units at 03/20/15 0801    Lab Results:  Results for orders placed or performed during the hospital encounter of 03/13/15 (from the past 48  hour(s))  Glucose, capillary     Status: Abnormal   Collection Time: 03/19/15  6:40 AM  Result Value Ref Range   Glucose-Capillary 58 (L) 65 - 99 mg/dL  Glucose, capillary     Status: Abnormal   Collection Time: 03/19/15  6:57 AM  Result Value Ref Range   Glucose-Capillary 58 (L) 65 - 99 mg/dL  Glucose, capillary     Status: Abnormal   Collection Time: 03/19/15  8:02 AM  Result Value Ref Range   Glucose-Capillary 139 (H) 65 - 99 mg/dL  Glucose, capillary     Status: Abnormal   Collection Time: 03/20/15  6:51 AM  Result Value Ref Range   Glucose-Capillary 45 (L) 65 - 99 mg/dL  Glucose, capillary     Status: Abnormal   Collection Time: 03/20/15  7:10 AM  Result Value Ref Range   Glucose-Capillary 64 (L) 65 - 99 mg/dL  Glucose, capillary     Status: Abnormal   Collection Time: 03/20/15 12:09 PM  Result Value Ref Range   Glucose-Capillary 214 (H) 65 - 99 mg/dL    Physical Findings: AIMS:  , ,  ,  ,    CIWA:    COWS:     Musculoskeletal: Strength & Muscle Tone: within normal limits Gait & Station: normal Patient leans: N/A  Psychiatric Specialty Exam: Review of Systems  All other systems reviewed and are negative.   Blood pressure 95/59, pulse 88, temperature 97.9 F (36.6 C), temperature source Oral, resp. rate 20, height  (1.626 m), weight 54.885 kg (121 lb), SpO2 100 %.Body mass index is 20.76 kg/(m^2).  General Appearance: Casual  Eye Contact::  Good  Speech:  Clear and Coherent  Volume:  Normal  Mood:  Euthymic  Affect:  Appropriate  Thought Process:  Disorganized  Orientation:  Other:  To person and place only  Thought Content:  Delusions and Paranoid Ideation  Suicidal Thoughts:  No  Homicidal Thoughts:  No  Memory:  Immediate;   Poor Recent;   Poor Remote;   Poor  Judgement:  Poor  Insight:  Lacking  Psychomotor Activity:  Normal  Concentration:  Poor  Recall:  Poor  Fund of Knowledge:Fair  Language: Fair  Akathisia:  No  Handed:  Right  AIMS  (if indicated):     Assets:  Communication Skills Desire for Improvement Financial Resources/Insurance Housing Physical Health Resilience Social Support  ADL's:  Intact  Cognition: WNL  Sleep:  Number of Hours: 9   Treatment Plan Summary: Daily contact with patient to assess and evaluate symptoms and progress in treatment and Medication management   Laurie Morton is a 74 year old Caucasian female with no  prior psychiatry history who presented to our emergency department with persecutory delusions and hallucinations.  1. Psychosis unspecified: All basic blood work including CBC, compressive metabolic panel, UA, TSH and urine toxicology screen were within normal limits. Patient was not intoxicated as her alcohol level was below the detection limit. Patient has been is started on Risperdal. I will increase it today to 3 mg.   2. B12 deficiency. B-12 level was < 300 and will give B-12 injection as it may be contributing to problems with confusion and memory. She will get 3 days of B12 injections and then 1000mcg po daily  3. History of depression and anxiety: The patient was switched from Prozac to Zoloft but had problems with explosive diarrhea on the Zoloft. We switched to Celexa 10 mg daily with a plan to titrate up as tolerated and needed.  4. Dyslipidemia: Patient is treated with Lopid 600 mg by mouth twice a day and fenofibrate 54 mg a day.  5. GERD: We continued Protonix 40 mg a day  6. Vitamin D deficiency: Patient will be continued on vitamin D 50,000 units  7. Poor oral intake: She gas glucerna tid and will push fluids orally.  8. Diabetes. We'll continue metformin and Amaryl along with ADA Diet and blood glucose monitoring. Her blood sugar was 56 today. We will lower metformin to 500 mg while in the hospital.   9. Diarrhea. She has been moderately available. Her diarrhea is thought to be from Zoloft but the patient believes is from cholesterol lowering drugs. It could also be  from metformin. Apparently the patient has not been taking all her medications at home as prescribed   10. Metabolic syndrome. Cholesterol and TG are elevated, HbA1c 7.4.  11. Labs: ammonia level, RPR, HIV, all non reactive /wnl.  12. Imaging: Taken to MRI but refused saying that the MRI machine was going to kill her or burn her. Will retry MRI Head  13. Disposition. She will likely return to her apartment although there is a plan to stay with her son. Collateral information was obtained from the patient's son phone number [phone]. Brandon  No medication changes were offered today 03/20/2015  Jodye Scali 03/20/2015, 1:51 PM

## 2015-03-20 NOTE — Progress Notes (Signed)
Inpatient Diabetes Program Recommendations  AACE/ADA: New Consensus Statement on Inpatient Glycemic Control (2015)  Target Ranges:  Prepandial:   less than 140 mg/dL      Peak postprandial:   less than 180 mg/dL (1-2 hours)      Critically ill patients:  140 - 180 mg/dL  Results for Laurie Morton, Laurie Morton (MRN [id number]) as of 03/20/2015 09:45  Ref. Range 03/19/2015 06:57 03/19/2015 08:02 03/20/2015 06:51 03/20/2015 07:10  Glucose-Capillary Latest Ref Range: 65-99 mg/dL 58 (L) 644139 (Morton) 45 (L) 64 (L)   Review of Glycemic Control  Diabetes history: DM2 Outpatient Diabetes medications: Amaryl 8 mg QAM, Metformin 1000 mg BID Current orders for Inpatient glycemic control: Amaryl 8 mg QAM, Metformin 500 mg BID  Inpatient Diabetes Program Recommendations: Oral Agents: Fasting glucose 58 mg/dl on 03/47/4212/26/16 and 45 mg/dl this morning. Please consider discontinuing Amaryl at this time due to hypoglycemia. Do not anticipate that Metformin is causing hypoglycemia and okay to continue it. Glucose monitoring: Please consider ordering CBGs ACHS to follow glycemic control more closely.  Thanks, Orlando PennerMarie Zeeva Courser, RN, MSN, CDE Diabetes Coordinator Inpatient Diabetes Program [phone] (Team Pager from 8am to 5pm) [phone] ([facility] office) [phone] [facility] office) [phone] [facility] office)

## 2015-03-20 NOTE — Progress Notes (Signed)
Pt spent the evening in her room reading a book. Pt mood is euthymic, congruent affect. Pt denies any SI/HI/VAH; requested for blankets, no other concerns voiced. Will continue to monitor closely for safety.

## 2015-03-20 NOTE — [facility] Group Notes (Signed)
[facility] Group Notes:  (Nursing/MHT/Case Management/Adjunct)  Date:  03/20/2015  Time:  2:31 PM  Type of Therapy:  Psychoeducational Skills  Participation Level:  Minimal  Participation Quality:  Appropriate and Attentive  Affect:  Flat  Cognitive:  Appropriate  Insight:  Improving  Engagement in Group:  Limited  Modes of Intervention:  Education and Support  Summary of Progress/Problems:  Laurie Morton 03/20/2015, 2:31 PM

## 2015-03-20 NOTE — Plan of Care (Signed)
Problem: Alteration in thought process Goal: STG-Patient is able to identify plan for continuing care at ( Patient is able to identify continuing plan for care at discharge)  Outcome: Progressing Working on Coping skills

## 2015-03-21 LAB — GLUCOSE, CAPILLARY
Glucose-Capillary: 115 mg/dL — ABNORMAL HIGH (ref 65–99)
Glucose-Capillary: 107 mg/dL — ABNORMAL HIGH (ref 65–99)
Glucose-Capillary: 40 mg/dL — CL (ref 65–99)
Glucose-Capillary: 209 mg/dL — ABNORMAL HIGH (ref 65–99)
Glucose-Capillary: 209 mg/dL — ABNORMAL HIGH (ref 65–99)

## 2015-03-21 MED ORDER — LORAZEPAM 1 MG PO TABS
1.0000 mg | ORAL_TABLET | Freq: Once | ORAL | Status: AC | PRN
  Administered 2015-03-22: 1 mg via ORAL
  Filled 2015-03-21: qty 1

## 2015-03-21 MED ORDER — INSULIN ASPART 100 UNIT/ML ~~LOC~~ SOLN
0.0000 [IU] | Freq: Three times a day (TID) | SUBCUTANEOUS | Status: DC
  Administered 2015-03-22: 3 [IU] via SUBCUTANEOUS
  Administered 2015-03-23: 5 [IU] via SUBCUTANEOUS
  Administered 2015-03-23 – 2015-03-25 (×3): 3 [IU] via SUBCUTANEOUS
  Administered 2015-03-25 – 2015-03-26 (×2): 2 [IU] via SUBCUTANEOUS
  Filled 2015-03-21: qty 3
  Filled 2015-03-21: qty 5
  Filled 2015-03-21: qty 2
  Filled 2015-03-21: qty 3
  Filled 2015-03-21: qty 5

## 2015-03-21 NOTE — Progress Notes (Signed)
Recreation Therapy Notes  Date: 12.28.16 Time: 3:00 pm Location: Craft Room  Group Topic: Self-esteem  Goal Area(s) Addresses:  Patient will identify positive attributes about self. Patient will identify at least one coping skill.  Behavioral Response: Did not attend  Intervention: All About Me  Activity: Patients were instructed to make an "All About Me" pamphlet including their life's motto, positive traits, healthy coping skills, and their support system.  Education: LRT educated patients on ways they can increase their self-esteem.  Education Outcome: Patient did not attend group.  Clinical Observations/Feedback: Patient came to group and told LRT that she was pregnant and would have the baby at 12 o'clock tonight. Patient had a difficult time trying to figure out if she was going to group or back to her room. LRT encouraged patient to go to group. Patient washed her hands and decided to leave because she was worried the people who deliver the baby would not know where she was. She stated that they knew where her room was and she was going to go there. Patient also reported she has had a hysterectomy and did not know how she was pregnant.  Jacquelynn CreeGreene,Dominic Rhome M, LRT/CTRS 03/21/2015 4:28 PM

## 2015-03-21 NOTE — Progress Notes (Signed)
Adult (Non-Pregnant) Hypoglycemia Protocol Treatment Guidelines  1.  RN shall initiate Hypoglycemia Protocol emergency measures immediately when:            w Routine or STAT CBG and/or a lab glucose indicates hypoglycemia (CBG < 70 mg/dl)  2.  Treat the patient according to ability to take PO's and severity of hypoglycemia.   3.  If patient is on GlucoStabilizer, follow directions provided by [facility] for hypoglycemic events.  4.  If patient on insulin pump, follow Hypoglycemia Protocol.  If patient requires more than one treatment have patient place pump in SUSPEND and notify MD.  DO NOT leave pump in SUSPEND for greater than 30 minutes unless ordered by MD.  A.  Treatment for Mild or Moderate-Patient cooperative and able to swallow    1.  Patient taking PO's and can cooperate   a.  Give one of the following 15 gram CHO options:                           w 1 tube oral dextrose gelHypoglycemic Event  CBG: 40  Treatment: 15 GM carbohydrate snack  Symptoms: Shaky  Follow-up CBG: Time:0723 CBG Result:107  Possible Reasons for Event: Unknown  Comments/MD notified: Laurie Morton 0728    Laurie DownerHerbin, Laurie Morton Laurie Morton                             w 3-4 Glucose tablets                           w 4 oz. Juice                           w 4 oz. regular soda                                    ESRD patients:  clear, regular soda                           w 8 oz. skim milk    b.  Recheck CBG in 15 minutes after treatment                            w If CBG < 70 mg/dl, repeat treatment and recheck until hypoglycemia is resolved                            w If CBG > 70 mg/dl and next meal is more than 1 hour away, give additional 15 grams CHO   2.  Patient NPO-Patient cooperative and no altered mental status    a.  Give 25 ml of D50 IV.   b.  Recheck CBG in 15 minutes after treatment.                             w If CBG is less than 70 mg/dl, repeat treatment and recheck until  hypoglycemia is resolved.   c.  Notify MD for further orders.             SPECIAL CONSIDERATIONS:    a.  If no IV access,                              w Start IV of D5W at [facility]                             w Give 25 ml of D50 IV.    b.  If unable to gain IV access                             w Give Glucagon IM:    i.  1 mg if patient weighs more than 45.5 kg    ii.  0.5 mg if patient weighs less than 45.5 kg   c.  Notify MD for further orders  B.  Treatment for Severe-- Patient unconscious or unable to take PO's safely    1.  Position patient on side   2.  Give 50 ml D50 IV   3.  Recheck CBG in 15 minutes.                    w If CBG is less than 70 mg/dl, repeat treatment and recheck until hypoglycemia is resolved.   4.  Notify MD for further orders.    SPECIAL CONSIDERATIONS:    a.  If no IV access                              w Give Glucagon IM                                        i.  1 mg if patient weighs more than 45.5 kg                                       ii.  0.5 mg if patient weighs less than 45.5 kg                              w Start IV of D5W at 50 ml/hr and give 50 ml D50 IV   b.  If no IV access and active seizure                               w Call Rapid Response   c.  If unable to gain IV access, give Glucagon IM:                              w 1 mg if patient weighs more than 45.5 kg                              w 0.5 mg if patient weighs less than 45.5 kg   d.  Notify MD for further orders.  C.  Complete smart text progress note to document intervention and follow-up CBG   1.  In [facility] patient chart, click on Notes (left  side of screen)   2.  Create Progress Note   3.  Click on [employer].  In the Match box type "hypo" and enter    4.  Double click on [facility] IP HYPOGLYCEMIC EVENT and enter data   5.  MD must be notified if patient is NPO or experienced severe hypoglycemia

## 2015-03-21 NOTE — [facility] Group Notes (Signed)
[facility] LCSW Aftercare Discharge Planning Group Note  03/21/2015 9:15 AM  Participation Quality: Did Not Attend. Patient invited to participate but declined.   Jeric Slagel F. Quint Chestnut, MSW, LCSWA, LCAS   

## 2015-03-21 NOTE — Progress Notes (Signed)
Isolative to room except during medication time, complied with bedtime Risperidone 3 mg; denied SI/HI, denied AV/H, quiet, appropriate in mood and affect, pleasant, no behavioral problems.

## 2015-03-21 NOTE — Progress Notes (Signed)
Nutrition Follow-up    INTERVENTION:   Meals and Snacks: Cater to patient preferences; pt with hypoglycemia in AM, recommend bedtime snack. Noted inpatient glycemic team is following and has made recommendations regarding medications Medical Food Supplement Therapy: continue Glucerna shakes  NUTRITION DIAGNOSIS:   Reassess on follow  GOAL:   Patient will meet greater than or equal to 90% of their needs  MONITOR:    (Energy Intake, Glucose Profile, Anthropometrics)  REASON FOR ASSESSMENT:   Malnutrition Screening Tool    ASSESSMENT:   Pt admitted with psychosis in elderly.  Pt remains confused  Diet Order:  Diet heart healthy/carb modified Room service appropriate?: Yes; Fluid consistency:: Thin  Energy Intake: recorded po intake 50% on average  Electrolyte and Renal Profile: No results for input(s): BUN, CREATININE, NA, K, MG, PHOS in the last 168 hours. Glucose Profile:  Recent Labs  03/20/15 2213 03/21/15 0655 03/21/15 0723  GLUCAP 115* 40* 107*   Meds: ss novolog  Height:   Ht Readings from Last 1 Encounters:  03/13/15 5\' 4"  (1.626 m)    Weight:   Wt Readings from Last 1 Encounters:  03/13/15 121 lb (54.885 kg)   BMI:  Body mass index is 20.76 kg/(m^2).  EDUCATION NEEDS:   No education needs identified at this time  Romelle StarcherCate Margeret Stachnik MS, RD, LDN [phone] Pager  [phone] Weekend/On-Call Pager

## 2015-03-21 NOTE — Progress Notes (Signed)
D: Patient  Continues to pace floors on unit . Limited interacting with  peers and staff . Affect flat with depressed mood . Patient stated slept fair last night .Stated appetite is good and energy level  Low. Patient  Stated concentration is good . Stated on Depression scale 0 , hopeless 0 and anxiety 0 .( low 0-10 high) Denies suicidal  homicidal ideations  .   No pain concerns . Appropriate ADL'S. Interacting with peers and staff. Periods of lucidity .  A: Encourage patient participation with unit programming . Instruction  Given on  Medication , verbalize understanding. R: Voice no other concerns. Staff continue to monitor

## 2015-03-21 NOTE — Progress Notes (Signed)
Recreation Therapy Notes  INPATIENT RECREATION TR PLAN  Patient Details Name: Laurie Morton MRN: [id number] DOB: [birth year] Today's Date: 03/21/2015  Rec Therapy Plan Is patient appropriate for Therapeutic Recreation?: Yes Treatment times per week: At least once a week TR Treatment/Interventions: 1:1 session, Group participation (Comment) (Appropriate participation in daily recreation therapy tx)  Discharge Criteria Pt will be discharged from therapy if:: Treatment goals are met, Discharged Treatment plan/goals/alternatives discussed and agreed upon by:: Patient/family  Discharge Summary Short term goals set: See Care Plan Short term goals met: Complete Progress toward goals comments: One-to-one attended Which groups?: Self-esteem, Coping skills, Goal setting One-to-one attended: Self-esteem, stress management Reason goals not met: N/A Therapeutic equipment acquired: None Reason patient discharged from therapy: Treatment goals met Pt/family agrees with progress & goals achieved: Yes Date patient discharged from therapy: 03/21/15   Leonette Monarch, LRT/CTRS 03/21/2015, 4:36 PM

## 2015-03-21 NOTE — [facility] Group Notes (Signed)
[facility] LCSW Group Therapy   03/21/2015 1:15 pm  Type of Therapy: Group Therapy   Participation Level: Did Not Attend. Patient invited to participate but declined.    Bernardino Dowell F. Janathan Bribiesca, MSW, LCSWA, LCAS   

## 2015-03-21 NOTE — Plan of Care (Signed)
Problem: [facility] Participation in Recreation Therapeutic Interventions Goal: STG-Patient will demonstrate improved self esteem by identif STG: Self-Esteem - Within 4 treatment sessions, patient will verbalize at least 5 positive affirmation statements in each of 2 treatment sessions to increase self-esteem post d/c.  Outcome: Completed/Met Date Met:  03/21/15 Treatment Session 2; Completed 2 out of 2: At approximately 12:35 pm, LRT met with patient in patient room. Patient verbalized 5 positive affirmation statements. Patient reported it felt "good". LRT encouraged patient to continue saying positive affirmation statements. Intervention Used: I Am statements  Leonette Monarch, LRT/CTRS 12.28.16 1:35 pm Goal: STG-Other Recreation Therapy Goal (Specify) STG: Stress Management - Within 4 treatment sessions, patient will verbalize understanding of the stress management techniques in each of 2 treatment sessions to increase stress management skills post d/c.  Outcome: Completed/Met Date Met:  03/21/15 Treatment Session 2; Completed 2 out of 2: At approximately 12:35 pm, LRT met with patient in patient room. LRT educated patient on stress management techniques. Patient verbalized understanding. LRT encouraged patient to read over and practice the stress management techniques. Intervention Used: Stress Management handouts  Leonette Monarch, LRT/CTRS 12.28.16 1:36 pm

## 2015-03-21 NOTE — Plan of Care (Signed)
Problem: Ineffective individual coping Goal: STG: Patient will remain free from self harm Outcome: Progressing 15 minute checks maintained for safety, clinical and moral support provided, patient encouraged to continue to express feelings and demonstrate safe care. Patient remain free from harm, will continue to monitor.          

## 2015-03-21 NOTE — Progress Notes (Addendum)
Inpatient Diabetes Program Recommendations  AACE/ADA: New Consensus Statement on Inpatient Glycemic Control (2015)  Target Ranges:  Prepandial:   less than 140 mg/dL      Peak postprandial:   less than 180 mg/dL (1-2 hours)      Critically ill patients:  140 - 180 mg/dL  Results for Laurie Morton, Laurie Morton (MRN [id number]) as of 03/21/2015 08:08  Ref. Range 03/20/2015 06:51 03/20/2015 07:10 03/20/2015 12:09 03/20/2015 17:01 03/20/2015 22:13 03/21/2015 06:55 03/21/2015 07:23  Glucose-Capillary Latest Ref Range: 65-99 mg/dL 45 (L) 64 (L) 657214 (Morton) 846223 (Morton) 115 (Morton) 40 (LL) 107 (Morton)   Review of Glycemic Control  Diabetes history: DM2 Outpatient Diabetes medications: Amaryl 8 mg QAM, Metformin 1000 mg BID Current orders for Inpatient glycemic control: Metformin 500 mg BID, Amaryl 8 mg QAM  Inpatient Diabetes Program Recommendations:  Insulin-Correction: Please consider ordering CBGs with Novolog sensitive correction scale ACHS using the Glycemic Control Order Set. Oral Agents: Noted fasting glucose 40 mg/dl this morning at 9:626:55 am. According to the chart NO Amaryl given on 03/20/15 and only evening dose of Metformin was given on 03/20/15. Please consider discontinuing Metformin and Amaryl at this time and use Novolog correction scale for inpatient glycemic control.  Thanks, Orlando PennerMarie Findley Blankenbaker, RN, MSN, CDE Diabetes Coordinator Inpatient Diabetes Program [phone] (Team Pager from 8am to 5pm) [phone] ([facility] office) [phone] [facility] office) [phone] [facility] office)

## 2015-03-21 NOTE — Plan of Care (Signed)
Problem: Ineffective individual coping Goal: LTG: Patient will report a decrease in negative feelings Outcome: Not Progressing Periods of lucidity looking for family members on unit  floor

## 2015-03-21 NOTE — Progress Notes (Addendum)
[facility]  03/21/2015 1:29 PM Ardine EngCarolyn H Dierks  MRN:  [id number]  Subjective:  Laurie Morton is utterly but pleasantly confused. She has been looking for her son's which she believes are on the unit all day long. She believes she is going home today but does not insist. She has no somatic complaints. She seems slightly less paranoid. Sleep and appetite are fair. She tries to participate in programming.  Principal Problem: Psychosis in elderly with behavioral disturbance Diagnosis:   Patient Active Problem List   Diagnosis Date Noted  . B12 deficiency [E53.8] 03/18/2015  . Diabetes (HCC) [E11.9] 03/14/2015  . Essential hypertension [I10] 03/14/2015  . Dyslipidemia [E78.5] 03/14/2015  . Hearing loss [H91.90] 03/14/2015  . GERD (gastroesophageal reflux disease) [K21.9] 03/14/2015  . Vitamin D deficiency [E55.9] 03/14/2015  . 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  Past Medical History:  Past Medical History  Diagnosis Date  . Diabetes mellitus without complication (HCC)   . Hypertension    History reviewed. No pertinent past surgical history. Family History: History reviewed. No pertinent family history. Family Psychiatric  History: See H&P. Social History:  History  Alcohol Use No     History  Drug Use Not on file    Social History   Social History  . Marital Status: Single    Spouse Name: N/A  . Number of Children: N/A  . Years of Education: N/A   Social History Main Topics  . Smoking status: Never Smoker   . Smokeless tobacco: None  . Alcohol Use: No  . Drug Use: None  . Sexual Activity: Not Asked   Other Topics Concern  . None   Social History Narrative   Additional Social History:                         Sleep: Fair  Appetite:  Fair  Current Medications: Current Facility-Administered Medications  Medication Dose Route  Frequency Provider Last Rate Last Dose  . acetaminophen (TYLENOL) tablet 650 mg  650 mg Oral Q6H PRN Jimmy FootmanAndrea Hernandez-Gonzalez, MD      . alum & mag hydroxide-simeth (MAALOX/MYLANTA) 200-200-20 MG/5ML suspension 30 mL  30 mL Oral Q4H PRN Jimmy FootmanAndrea Hernandez-Gonzalez, MD      . citalopram (CELEXA) tablet 10 mg  10 mg Oral Daily Darliss RidgelAarti K Kapur, MD   10 mg at 03/21/15 0850  . cyanocobalamin tablet 1,000 mcg  1,000 mcg Oral Daily Darliss RidgelAarti K Kapur, MD   1,000 mcg at 03/21/15 0850  . feeding supplement (GLUCERNA SHAKE) (GLUCERNA SHAKE) liquid 237 mL  237 mL Oral TID BM Jimmy FootmanAndrea Hernandez-Gonzalez, MD   237 mL at 03/21/15 0907  . fenofibrate tablet 54 mg  54 mg Oral Daily Jimmy FootmanAndrea Hernandez-Gonzalez, MD   54 mg at 03/21/15 0849  . fluticasone (FLONASE) 50 MCG/ACT nasal spray 2 spray  2 spray Each Nare Daily Jimmy FootmanAndrea Hernandez-Gonzalez, MD   2 spray at 03/21/15 [phone]  . gemfibrozil (LOPID) tablet 600 mg  600 mg Oral BID AC Jimmy FootmanAndrea Hernandez-Gonzalez, MD   600 mg at 03/21/15 0849  . loperamide (IMODIUM) capsule 2 mg  2 mg Oral TID PRN Darliss RidgelAarti K Kapur, MD   2 mg at 03/18/15 1001  . LORazepam (ATIVAN) tablet 0.25 mg  0.25 mg Oral Q6H PRN Jimmy FootmanAndrea Hernandez-Gonzalez, MD      . magnesium hydroxide (MILK OF MAGNESIA) suspension 30 mL  30 mL Oral Daily PRN Jimmy Footman, MD      . metFORMIN (GLUCOPHAGE-XR) 24 hr tablet 500 mg  500 mg Oral BID WC Carlena Ruybal B Neftali Abair, MD   500 mg at 03/20/15 1733  . pantoprazole (PROTONIX) EC tablet 40 mg  40 mg Oral Daily Jimmy Footman, MD   40 mg at 03/21/15 0850  . risperiDONE (RISPERDAL) tablet 3 mg  3 mg Oral QHS Shari Prows, MD   3 mg at 03/20/15 2216  . Vitamin D (Ergocalciferol) (DRISDOL) capsule 50,000 Units  50,000 Units Oral Weekly Brandy Hale, MD   50,000 Units at 03/20/15 0801    Lab Results:  Results for orders placed or performed during the hospital encounter of 03/13/15 (from the past 48 hour(s))  Glucose, capillary     Status: Abnormal    Collection Time: 03/20/15  6:51 AM  Result Value Ref Range   Glucose-Capillary 45 (L) 65 - 99 mg/dL  Glucose, capillary     Status: Abnormal   Collection Time: 03/20/15  7:10 AM  Result Value Ref Range   Glucose-Capillary 64 (L) 65 - 99 mg/dL  Glucose, capillary     Status: Abnormal   Collection Time: 03/20/15 12:09 PM  Result Value Ref Range   Glucose-Capillary 214 (H) 65 - 99 mg/dL  Glucose, capillary     Status: Abnormal   Collection Time: 03/20/15  5:01 PM  Result Value Ref Range   Glucose-Capillary 223 (H) 65 - 99 mg/dL  Glucose, capillary     Status: Abnormal   Collection Time: 03/20/15 10:13 PM  Result Value Ref Range   Glucose-Capillary 115 (H) 65 - 99 mg/dL   Comment 1 Notify RN   Glucose, capillary     Status: Abnormal   Collection Time: 03/21/15  6:55 AM  Result Value Ref Range   Glucose-Capillary 40 (LL) 65 - 99 mg/dL  Glucose, capillary     Status: Abnormal   Collection Time: 03/21/15  7:23 AM  Result Value Ref Range   Glucose-Capillary 107 (H) 65 - 99 mg/dL    Physical Findings: AIMS:  , ,  ,  ,    CIWA:    COWS:     Musculoskeletal: Strength & Muscle Tone: within normal limits Gait & Station: normal Patient leans: N/A  Psychiatric Specialty Exam: Review of Systems  All other systems reviewed and are negative.   Blood pressure 116/67, pulse 97, temperature 97.9 F (36.6 C), temperature source Oral, resp. rate 20, height  (1.626 m), weight 54.885 kg (121 lb), SpO2 100 %.Body mass index is 20.76 kg/(m^2).  General Appearance: Casual  Eye Contact::  Good  Speech:  Clear and Coherent  Volume:  Normal  Mood:  Anxious  Affect:  Flat  Thought Process:  Disorganized  Orientation:  Other:  Person and place only  Thought Content:  Delusions, Hallucinations: Auditory and Paranoid Ideation  Suicidal Thoughts:  No  Homicidal Thoughts:  No  Memory:  Immediate;   Poor Recent;   Poor Remote;   Poor  Judgement:  Poor  Insight:  Lacking  Psychomotor  Activity:  Normal  Concentration:  Poor  Recall:  Poor  Fund of Knowledge:Poor  Language: Poor  Akathisia:  No  Handed:  Right  AIMS (if indicated):     Assets:  Communication Skills Desire for Improvement Financial Resources/Insurance Housing Physical Health Resilience Social Support  ADL's:  Intact  Cognition: WNL  Sleep:  Number of Hours: 8   Treatment Plan Summary:  Daily contact with patient to assess and evaluate symptoms and progress in treatment and Medication management   Ms. Molock is a 74 year old Caucasian female with no prior psychiatry history who presented to our emergency department with persecutory delusions and hallucinations.  1. Psychosis unspecified: All basic blood work including CBC, compressive metabolic panel, UA, TSH and urine toxicology screen were within normal limits. Patient was not intoxicated as her alcohol level was below the detection limit. Patient has been is started on Risperdal. I will increase it today to 3 mg.   2. B12 deficiency. B-12 level was < 300 and will give B-12 injection as it may be contributing to problems with confusion and memory. She will get 3 days of B12 injections and then po daily  3. History of depression and anxiety: The patient was switched from Prozac to Zoloft but had problems with explosive diarrhea on the Zoloft. We switched to Celexa 10 mg daily with a plan to titrate up as tolerated and needed.  4. Dyslipidemia: Patient is treated with Lopid 600 mg by mouth twice a day and fenofibrate 54 mg a day.  5. GERD: We continued Protonix 40 mg a day  6. Vitamin D deficiency: Patient will be continued on vitamin D 50,000 units  7. Poor oral intake: She gas glucerna tid and will push fluids orally.  8. Diabetes. Sugars were low again today. We dis the guardian prescriptions as yet. There is no he she he is I continue metformin and Amaryl along with ADA Diet and blood glucose monitoring. Her blood sugar was 56 today.  We will lower metformin to 500 mg while in the hospital.   9. Diarrhea. Resolved.   10. Metabolic syndrome. Cholesterol and TG are elevated, HbA1c 7.4.  11. Labs: ammonia level, RPR, HIV, all non reactive /wnl.  12. Imaging: Taken to MRI but refused saying that the MRI machine was going to kill her or burn her. Will retry MRI Head  13. Disposition. She will likely return to her apartment although there is a plan to stay with her son. Collateral information was obtained from the patient's son phone number [phone]. Brandon  No medication changes were offered today 03/21/2015   Itzae Mccurdy 03/21/2015, 1:29 PM

## 2015-03-21 NOTE — [facility] Group Notes (Signed)
[facility] Group Notes:  (Nursing/MHT/Case Management/Adjunct)  Date:  03/21/2015  Time:  12:00 PM  Type of Therapy:  Psychoeducational Skills  Participation Level:  Minimal  Participation Quality:  Attentive  Affect:  Flat  Cognitive:  Appropriate  Insight:  Improving  Engagement in Group:  Supportive  Modes of Intervention:  Activity and Support  Summary of Progress/Problems:  Laurie Morton 03/21/2015, 12:00 PM

## 2015-03-22 ENCOUNTER — Inpatient Hospital Stay: Payer: Medicare Other

## 2015-03-22 LAB — GLUCOSE, CAPILLARY
Glucose-Capillary: 81 mg/dL (ref 65–99)
Glucose-Capillary: 182 mg/dL — ABNORMAL HIGH (ref 65–99)
Glucose-Capillary: 116 mg/dL — ABNORMAL HIGH (ref 65–99)
Glucose-Capillary: 56 mg/dL — ABNORMAL LOW (ref 65–99)
Glucose-Capillary: 68 mg/dL (ref 65–99)

## 2015-03-22 NOTE — Plan of Care (Signed)
Problem: Alteration in thought process Goal: STG-Patient is able to follow short directions Outcome: Progressing Pt able to logical answer assessment questions and follow commands.

## 2015-03-22 NOTE — Progress Notes (Signed)
D: Observed pt in room lying awake on bed. Patient alert and oriented x3. Patient denies SI/HI/AVH. Pt affect is anxious. Pt confused about an ambulance that was supposed to pick her up asking "did the ambulance leave?" Pt denies feeling anxious or depressed. When asked about her day pt stated "It's the same old routine." Pt has no complaints. Pt isolative to room this PM. A: Offered active listening and support. Provided therapeutic communication. Administered scheduled medications. Reoriented pt to unit and situation and explained that there was no ambulance for the pt. R: Pt continued to focus on ambulance, but seemed to understand when told ambulance was not here. Pt pleasant and cooperative. Will continue Q15 min. Checks. Safety maintained.

## 2015-03-22 NOTE — Progress Notes (Signed)
Continues to have some mild confusion.  Pleasant and cooperative.  Medication compliant.  Safety maintained.  Support and encouragement offered.

## 2015-03-22 NOTE — Progress Notes (Signed)
[facility] MD Progress Note  03/22/2015 3:50 PM ROSABELLE JUPIN  MRN:  [id number]  Subjective:  Ms. Kotara continues to be pleasantly confused.  She continuously worries about her sons who she believes will be picking her up from the hospital right now. She is less paranoid and agreed to MRI today. Unfortunately MRI indicates that there are unemployed the positions in the brain vessels. This is not treatable condition. We requested neurology consult. There are no somatic complaints. She tolerates medications well. She eagerly participates in programming in spite of confusion. Principal Problem: Psychosis in elderly with behavioral disturbance Diagnosis:   Patient Active Problem List   Diagnosis Date Noted  . B12 deficiency [E53.8] 03/18/2015  . Diabetes (HCC) [E11.9] 03/14/2015  . Essential hypertension [I10] 03/14/2015  . Dyslipidemia [E78.5] 03/14/2015  . Hearing loss [H91.90] 03/14/2015  . GERD (gastroesophageal reflux disease) [K21.9] 03/14/2015  . Vitamin D deficiency [E55.9] 03/14/2015  . Psychosis in elderly with behavioral disturbance [F03.91] 03/14/2015   Total Time spent with patient: 15 minutes  Past Psychiatric History: Psychosis and cognitive decline.  Past Medical History:  Past Medical History  Diagnosis Date  . Diabetes mellitus without complication (HCC)   . Hypertension    History reviewed. No pertinent past surgical history. Family History: History reviewed. No pertinent family history. Family Psychiatric  History: None reported. Social History:  History  Alcohol Use No     History  Drug Use Not on file    Social History   Social History  . Marital Status: Single    Spouse Name: N/A  . Number of Children: N/A  . Years of Education: N/A   Social History Main Topics  . Smoking status: Never Smoker   . Smokeless tobacco: None  . Alcohol Use: No  . Drug Use: None  . Sexual Activity: Not Asked   Other Topics Concern  . None   Social History Narrative    Additional Social History:                         Sleep: Fair  Appetite:  Fair  Current Medications: Current Facility-Administered Medications  Medication Dose Route Frequency Provider Last Rate Last Dose  . acetaminophen (TYLENOL) tablet 650 mg  650 mg Oral Q6H PRN Jimmy Footman, MD      . alum & mag hydroxide-simeth (MAALOX/MYLANTA) 200-200-20 MG/5ML suspension 30 mL  30 mL Oral Q4H PRN Jimmy Footman, MD      . citalopram (CELEXA) tablet 10 mg  10 mg Oral Daily Darliss Ridgel, MD   10 mg at 03/22/15 0831  . cyanocobalamin tablet 1,000 mcg  1,000 mcg Oral Daily Darliss Ridgel, MD   1,000 mcg at 03/22/15 0831  . feeding supplement (GLUCERNA SHAKE) (GLUCERNA SHAKE) liquid 237 mL  237 mL Oral TID BM Jimmy Footman, MD   237 mL at 03/22/15 1000  . fenofibrate tablet 54 mg  54 mg Oral Daily Jimmy Footman, MD   54 mg at 03/22/15 0831  . fluticasone (FLONASE) 50 MCG/ACT nasal spray 2 spray  2 spray Each Nare Daily Jimmy Footman, MD   2 spray at 03/22/15 0830  . gemfibrozil (LOPID) tablet 600 mg  600 mg Oral BID AC Jimmy Footman, MD   600 mg at 03/22/15 0831  . insulin aspart (novoLOG) injection 0-15 Units  0-15 Units Subcutaneous TID WC Shari Prows, MD   3 Units at 03/22/15 1225  . loperamide (IMODIUM) capsule 2 mg  2 mg Oral TID PRN Darliss RidgelAarti K Kapur, MD   2 mg at 03/18/15 1001  . LORazepam (ATIVAN) tablet 0.25 mg  0.25 mg Oral Q6H PRN Jimmy FootmanAndrea Hernandez-Gonzalez, MD      . magnesium hydroxide (MILK OF MAGNESIA) suspension 30 mL  30 mL Oral Daily PRN Jimmy FootmanAndrea Hernandez-Gonzalez, MD      . pantoprazole (PROTONIX) EC tablet 40 mg  40 mg Oral Daily Jimmy FootmanAndrea Hernandez-Gonzalez, MD   40 mg at 03/22/15 0831  . risperiDONE (RISPERDAL) tablet 3 mg  3 mg Oral QHS Shari ProwsJolanta B Pucilowska, MD   3 mg at 03/21/15 2126  . Vitamin D (Ergocalciferol) (DRISDOL) capsule 50,000 Units  50,000 Units Oral Weekly Brandy HaleUzma Faheem, MD   50,000  Units at 03/20/15 0801    Lab Results:  Results for orders placed or performed during the hospital encounter of 03/13/15 (from the past 48 hour(s))  Glucose, capillary     Status: Abnormal   Collection Time: 03/20/15  5:01 PM  Result Value Ref Range   Glucose-Capillary 223 (H) 65 - 99 mg/dL  Glucose, capillary     Status: Abnormal   Collection Time: 03/20/15 10:13 PM  Result Value Ref Range   Glucose-Capillary 115 (H) 65 - 99 mg/dL   Comment 1 Notify RN   Glucose, capillary     Status: Abnormal   Collection Time: 03/21/15  6:55 AM  Result Value Ref Range   Glucose-Capillary 40 (LL) 65 - 99 mg/dL  Glucose, capillary     Status: Abnormal   Collection Time: 03/21/15  7:23 AM  Result Value Ref Range   Glucose-Capillary 107 (H) 65 - 99 mg/dL  Glucose, capillary     Status: Abnormal   Collection Time: 03/21/15  4:49 PM  Result Value Ref Range   Glucose-Capillary 209 (H) 65 - 99 mg/dL  Glucose, capillary     Status: Abnormal   Collection Time: 03/21/15  9:01 PM  Result Value Ref Range   Glucose-Capillary 209 (H) 65 - 99 mg/dL  Glucose, capillary     Status: None   Collection Time: 03/22/15  6:52 AM  Result Value Ref Range   Glucose-Capillary 81 65 - 99 mg/dL  Glucose, capillary     Status: Abnormal   Collection Time: 03/22/15 12:04 PM  Result Value Ref Range   Glucose-Capillary 182 (H) 65 - 99 mg/dL   Comment 1 Notify RN     Physical Findings: AIMS:  , ,  ,  ,    CIWA:    COWS:     Musculoskeletal: Strength & Muscle Tone: within normal limits Gait & Station: normal Patient leans: N/A  Psychiatric Specialty Exam: Review of Systems  All other systems reviewed and are negative.   Blood pressure 86/51, pulse 92, temperature 98.6 F (37 C), temperature source Oral, resp. rate 20, height 5\' 4"  (1.626 m), weight 54.885 kg (121 lb), SpO2 100 %.Body mass index is 20.76 kg/(m^2).  General Appearance: Casual  Eye Contact::  Good  Speech:  Normal Rate  Volume:  Normal  Mood:   Euthymic  Affect:  Flat  Thought Process:  Disorganized  Orientation:  Other:  To person and place only  Thought Content:  Delusions and Paranoid Ideation  Suicidal Thoughts:  No  Homicidal Thoughts:  No  Memory:  Immediate;   Poor Recent;   Poor Remote;   Poor  Judgement:  Poor  Insight:  Lacking  Psychomotor Activity:  Normal  Concentration:  Poor  Recall:  Poor  Fund of Knowledge:Poor  Language: Poor  Akathisia:  No  Handed:  Right  AIMS (if indicated):     Assets:  Communication Skills Desire for Improvement Financial Resources/Insurance Housing Physical Health Resilience Social Support  ADL's:  Intact  Cognition: WNL  Sleep:  Number of Hours: 7.25   Treatment Plan Summary: Daily contact with patient to assess and evaluate symptoms and progress in treatment and Medication management   Ms. Quakenbush is a 74 year old Caucasian female with no prior psychiatry history who presented to our emergency department with persecutory delusions and hallucinations.  1. Psychosis unspecified: All basic blood work including CBC, compressive metabolic panel, UA, TSH and urine toxicology screen were within normal limits. Patient was not intoxicated as her alcohol level was below the detection limit. Patient has been is started on Risperdal.    2. B12 deficiency. B-12 level was < 300 and will give B-12 injection as it may be contributing to problems with confusion and memory. She will get 3 days of B12 injections and then po daily  3. History of depression and anxiety: The patient was switched from Prozac to Zoloft but had problems with explosive diarrhea on the Zoloft. We switched to Celexa 10 mg daily with a plan to titrate up as tolerated and needed.  4. Dyslipidemia: Patient is treated with Lopid 600 mg by mouth twice a day and fenofibrate 54 mg a day.  5. GERD: We continued Protonix 40 mg a day  6. Vitamin D deficiency: Patient will be continued on vitamin D 50,000  units  7. Poor oral intake: She gas glucerna tid and will push fluids orally.  8. Diabetes. We discontinued metformin and Amaryl. We continue ADA Diet and SSI.    9. Diarrhea. Resolved.   10. Metabolic syndrome. Cholesterol and TG are elevated, HbA1c 7.4.  11. Labs: ammonia level, RPR, HIV, all non reactive /wnl.  12. Imaging: Brain MRI, IMPRESSION: 1. No acute infarct. 2. Mild chronic small vessel ischemic disease. 3. 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 4. 1.1 cm subacute hemorrhage in the inferior right frontal lobe. Underlying cavernoma not excluded.  NEUROLOGY CONSULT IS GREATLY APPRECIATED.  13. Disposition. She will likely return to her apartment although there is a plan to stay with her son. Collateral information was obtained from the patient's son phone number [phone]. Pervis Hocking Pucilowska 03/22/2015, 3:50 PM

## 2015-03-22 NOTE — Tx Team (Signed)
Interdisciplinary Treatment Plan Update (Adult)  Date:  03/22/2015 Time Reviewed:  5:11 PM  Progress in Treatment: Attending groups: No. Participating in groups:  No. Taking medication as prescribed:  Yes. Tolerating medication:  Yes. Family/Significant othe contact made:  Yes, individual(s) contacted:  Son Patient understands diagnosis:  No. and As evidenced by:  Limited insight  Discussing patient identified problems/goals with staff:  Yes. Medical problems stabilized or resolved:  Yes. Denies suicidal/homicidal ideation: Yes. Issues/concerns per patient self-inventory:  Yes. Other:  New problem(s) identified: No, Describe:  NA  Discharge Plan or Barriers: Pt plans to return home and follow up with outpatient.    Reason for Continuation of Hospitalization: Delusions  Medication stabilization  Comments: Laurie Morton continues to be pleasantly confused. She continuously worries about her sons who she believes will be picking her up from the hospital right now. She is less paranoid and agreed to MRI today. Unfortunately MRI indicates that there are unemployed the positions in the brain vessels. This is not treatable condition. We requested neurology consult. There are no somatic complaints. She tolerates medications well. She eagerly participates in programming in spite of confusion  Estimated length of stay: 7 days   New goal(s):  Review of initial/current patient goals per problem list:   1.  Goal(s): Patient will participate in aftercare plan * Met:  * Target date: at discharge * As evidenced by: Patient will participate within aftercare plan AEB aftercare provider and housing plan at discharge being identified.  2.  Goal (s): Patient will demonstrate decreased symptoms of psychosis. * Met: No  *  Target date: at discharge * As evidenced by: Patient will not endorse signs of psychosis or be deemed stable for discharge by MD.   Attendees: Patient:  Laurie Morton  12/29/20165:11 PM  Family:   12/29/20165:11 PM  Physician:   Dr. Bary Leriche  12/29/20165:11 PM  Nursing:   Anderson Malta, RN  12/29/20165:11 PM  Case Manager:   12/29/20165:11 PM  Counselor:   12/29/20165:11 PM  Other:  [place] 12/29/20165:11 PM  Other:   12/29/20165:11 PM  Other:   12/29/20165:11 PM  Other:  12/29/20165:11 PM  Other:  12/29/20165:11 PM  Other:  12/29/20165:11 PM  Other:  12/29/20165:11 PM  Other:  12/29/20165:11 PM  Other:  12/29/20165:11 PM  Other:   12/29/20165:11 PM   Scribe for Treatment Team:   Campbell Stall Jaynia Fendley,MSW, [place]  03/22/2015, 5:11 PM

## 2015-03-22 NOTE — [facility] Group Notes (Signed)
[facility] LCSW Group Therapy   03/22/2015 11:00 am   Type of Therapy: Group Therapy   Participation Level: None   Participation Quality: None  Affect: Fatigued   Cognitive: Alert and Oriented   Insight: Developing/Improving and Engaged   Engagement in Therapy: Developing/Improving and Engaged   Modes of Intervention: Clarification, Confrontation, Discussion, Education, Exploration, Limit-setting, Orientation, Problem-solving, Rapport Building, Dance movement psychotherapisteality Testing, Socialization and Support   Summary of Progress/Problems: The topic for group was balance in life. Pt presented as fatigued and irritable and repeatedly fell asleep during group.  Pt cited her meds as the reason for her fatigue.

## 2015-03-23 DIAGNOSIS — F0391 Unspecified dementia with behavioral disturbance: Secondary | ICD-10-CM

## 2015-03-23 DIAGNOSIS — I680 Cerebral amyloid angiopathy: Secondary | ICD-10-CM

## 2015-03-23 LAB — GLUCOSE, CAPILLARY
Glucose-Capillary: 75 mg/dL (ref 65–99)
Glucose-Capillary: 206 mg/dL — ABNORMAL HIGH (ref 65–99)
Glucose-Capillary: 189 mg/dL — ABNORMAL HIGH (ref 65–99)
Glucose-Capillary: 170 mg/dL — ABNORMAL HIGH (ref 65–99)

## 2015-03-23 NOTE — Progress Notes (Signed)
D: Pt denies SI/HI/AVH, pt is pleasant and cooperative, with periods of confusion to time and place, patient appears less anxious and she is interacting with peers and staff appropriately.  A: Pt was offered support and encouragement. Pt was given scheduled medications. Pt was encouraged to attend groups. Q 15 minute checks were done for safety.  R:Pt attends groups and interacts well with peers and staff. Pt is taking medication. Pt has no complaints.Pt receptive to treatment and safety maintained on unit.

## 2015-03-23 NOTE — Plan of Care (Signed)
Problem: Ineffective individual coping Goal: LTG: Patient will report a decrease in negative feelings Outcome: Progressing Patient denies SI/HI.      

## 2015-03-23 NOTE — Progress Notes (Signed)
Inpatient Diabetes Program Recommendations  AACE/ADA: New Consensus Statement on Inpatient Glycemic Control (2015)  Target Ranges:  Prepandial:   less than 140 mg/dL      Peak postprandial:   less than 180 mg/dL (1-2 hours)      Critically ill patients:  140 - 180 mg/dL   Review of Glycemic Control   Diabetes history: DM2 Outpatient Diabetes medications: Amaryl 8 mg QAM, Metformin 1000 mg BID Current orders for Inpatient glycemic control: Novolog moderate correction insulin 0-15 units tid   Inpatient Diabetes Program Recommendations:  Patient experienced a low blood sugar last evening- consider decreasing correction insulin to Novolog sensitive correction 0-9 units tid with meals.   Susette RacerJulie Majesty Stehlin, RN, BA, MHA, CDE Diabetes Coordinator Inpatient Diabetes Program  [phone] (Team Pager) [phone] [facility] Office) 03/23/2015 9:44 AM

## 2015-03-23 NOTE — [facility] Group Notes (Signed)
[facility]   03/23/2015 9:15 AM  ?  Participation Quality: Alert, Appropriate and Oriented   Mood/Affect: Depressed and Flat   Depression Rating: 2  Anxiety Rating: 0  Thoughts of Suicide: Pt denies SI/HI   Will you contract for safety? Yes   Current AVH: Pt denies   Plan for Discharge/Comments: Pt attended discharge planning group and actively participated in group. CSW provided pt with today's workbook. Pt plans to discharge and return home and feels confident that she will be successful upon discharge.  Pt was polite and cooperative with the CSW and other group members and focused and attentive to the topics discussed and the sharing of others.   Transportation Means: Pt reports access to transportation   Supports: Pt lists family and sons as primary supports     Dorothe PeaJonathan F. Kristyna Bradstreet, MSW, LCSWA, LCAS

## 2015-03-23 NOTE — Progress Notes (Signed)
D: [name] has been calm, cooperative, and compliant with treatment today. She denied SI/HI/AVH and pain. She has voiced few needs but has worried her insulin is elevating her blood sugar. Education attempted.  A: Meds given as ordered. Q15 safety checks maintained. Support/encouragement offered. R: Pt remains free from harm and continues with treatment. Will continue to monitor for needs/safety.
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
## 2015-03-23 NOTE — Progress Notes (Signed)
[facility]  03/23/2015 8:05 PM Laurie Morton  MRN:  [id number]  Subjective:  Ms. Laurie LovelessGainey seems more oriented today. She is able to have a sensible conversation about discharge. Her sons want her placed but she does not agree. She wants to return to her apartment. She denies any symptoms of depression, anxiety or psychosis.   Principal Problem: Psychosis in elderly with behavioral disturbance Diagnosis:   Patient Active Problem List   Diagnosis Date Noted  . B12 deficiency [E53.8] 03/18/2015  . Diabetes (HCC) [E11.9] 03/14/2015  . Essential hypertension [I10] 03/14/2015  . Dyslipidemia [E78.5] 03/14/2015  . Hearing loss [H91.90] 03/14/2015  . GERD (gastroesophageal reflux disease) [K21.9] 03/14/2015  . Vitamin D deficiency [E55.9] 03/14/2015  . 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 Diabetes mellitus without complication (HCC)   . Hypertension    History reviewed. No pertinent past surgical history. Family History: History reviewed. No pertinent family history. Family Psychiatric  History: see H&P Social History:  History  Alcohol Use No     History  Drug Use Not on file    Social History   Social History  . Marital Status: Single    Spouse Name: N/A  . Number of Children: N/A  . Years of Education: N/A   Social History Main Topics  . Smoking status: Never Smoker   . Smokeless tobacco: None  . Alcohol Use: No  . Drug Use: None  . Sexual Activity: Not Asked   Other Topics Concern  . None   Social History Narrative   Additional Social History:                         Sleep: Fair  Appetite:  Fair  Current Medications: Current Facility-Administered Medications  Medication Dose Route Frequency Provider Last Rate Last Dose  . acetaminophen (TYLENOL) tablet 650  mg  650 mg Oral Q6H PRN Jimmy FootmanAndrea Hernandez-Gonzalez, MD      . alum & mag hydroxide-simeth (MAALOX/MYLANTA) 200-200-20 MG/5ML suspension 30 mL  30 mL Oral Q4H PRN Jimmy FootmanAndrea Hernandez-Gonzalez, MD      . citalopram (CELEXA) tablet 10 mg  10 mg Oral Daily Darliss RidgelAarti K Kapur, MD   10 mg at 03/23/15 [id number]  . cyanocobalamin tablet 1,000 mcg  1,000 mcg Oral Daily Darliss RidgelAarti K Kapur, MD   1,000 mcg at 03/23/15 [phone]  . feeding supplement (GLUCERNA SHAKE) (GLUCERNA SHAKE) liquid 237 mL  237 mL Oral TID BM Jimmy FootmanAndrea Hernandez-Gonzalez, MD   237 mL at 03/23/15 1018  . fenofibrate tablet 54 mg  54 mg Oral Daily Jimmy FootmanAndrea Hernandez-Gonzalez, MD   54 mg at 03/23/15 [id number]  . fluticasone (FLONASE) 50 MCG/ACT nasal spray 2 spray  2 spray Each Nare Daily Jimmy FootmanAndrea Hernandez-Gonzalez, MD   2 spray at 03/23/15 [phone]  . gemfibrozil (LOPID) tablet 600 mg  600 mg Oral BID AC Jimmy FootmanAndrea Hernandez-Gonzalez, MD   600 mg at 03/23/15 1646  . insulin aspart (novoLOG) injection 0-15 Units  0-15 Units Subcutaneous TID WC Shari ProwsJolanta B Yarnell Arvidson, MD   3 Units at 03/23/15 1646  . loperamide (IMODIUM) capsule 2 mg  2 mg Oral TID PRN Darliss RidgelAarti K Kapur, MD   2 mg at 03/18/15 1001  . LORazepam (ATIVAN) tablet 0.25 mg  0.25 mg Oral Q6H PRN Jimmy FootmanAndrea Hernandez-Gonzalez, MD      .  magnesium hydroxide (MILK OF MAGNESIA) suspension 30 mL  30 mL Oral Daily PRN Jimmy Footman, MD      . pantoprazole (PROTONIX) EC tablet 40 mg  40 mg Oral Daily Jimmy Footman, MD   40 mg at 03/23/15 1610  . risperiDONE (RISPERDAL) tablet 3 mg  3 mg Oral QHS Shari Prows, MD   3 mg at 03/22/15 2111  . Vitamin D (Ergocalciferol) (DRISDOL) capsule 50,000 Units  50,000 Units Oral Weekly Brandy Hale, MD   50,000 Units at 03/20/15 0801    Lab Results:  Results for orders placed or performed during the hospital encounter of 03/13/15 (from the past 48 hour(s))  Glucose, capillary     Status: Abnormal   Collection Time: 03/21/15  9:01 PM  Result Value Ref Range    Glucose-Capillary 209 (H) 65 - 99 mg/dL  Glucose, capillary     Status: None   Collection Time: 03/22/15  6:52 AM  Result Value Ref Range   Glucose-Capillary 81 65 - 99 mg/dL  Glucose, capillary     Status: Abnormal   Collection Time: 03/22/15 12:04 PM  Result Value Ref Range   Glucose-Capillary 182 (H) 65 - 99 mg/dL   Comment 1 Notify RN   Glucose, capillary     Status: Abnormal   Collection Time: 03/22/15  5:03 PM  Result Value Ref Range   Glucose-Capillary 116 (H) 65 - 99 mg/dL  Glucose, capillary     Status: Abnormal   Collection Time: 03/22/15  8:40 PM  Result Value Ref Range   Glucose-Capillary 56 (L) 65 - 99 mg/dL  Glucose, capillary     Status: None   Collection Time: 03/22/15  9:10 PM  Result Value Ref Range   Glucose-Capillary 68 65 - 99 mg/dL  Glucose, capillary     Status: None   Collection Time: 03/23/15  6:50 AM  Result Value Ref Range   Glucose-Capillary 75 65 - 99 mg/dL  Glucose, capillary     Status: Abnormal   Collection Time: 03/23/15 11:37 AM  Result Value Ref Range   Glucose-Capillary 206 (H) 65 - 99 mg/dL   Comment 1 Notify RN   Glucose, capillary     Status: Abnormal   Collection Time: 03/23/15  4:18 PM  Result Value Ref Range   Glucose-Capillary 189 (H) 65 - 99 mg/dL   Comment 1 Notify RN     Physical Findings: AIMS:  , ,  ,  ,    CIWA:    COWS:     Musculoskeletal: Strength & Muscle Tone: within normal limits Gait & Station: normal Patient leans: N/A  Psychiatric Specialty Exam: Review of Systems  All other systems reviewed and are negative.   Blood pressure 91/58, pulse 89, temperature 98.6 F (37 C), temperature source Oral, resp. rate 20, height  (1.626 m), weight 54.885 kg (121 lb), SpO2 100 %.Body mass index is 20.76 kg/(m^2).  General Appearance: Casual  Eye Contact::  Good  Speech:  Clear and Coherent  Volume:  Normal  Mood:  Anxious  Affect:  Appropriate  Thought Process:  Goal Directed  Orientation:  Full (Time,  Place, and Person)  Thought Content:  Delusions, Hallucinations: Auditory Visual and Paranoid Ideation  Suicidal Thoughts:  No  Homicidal Thoughts:  No  Memory:  Immediate;   Poor Recent;   Poor Remote;   Poor  Judgement:  Poor  Insight:  Lacking  Psychomotor Activity:  Normal  Concentration:  Fair  Recall:  Fair  Fund of Knowledge:Fair  Language: Fair  Akathisia:  No  Handed:  Right  AIMS (if indicated):     Assets:  Communication Skills Desire for Improvement Financial Resources/Insurance Housing Resilience Social Support  ADL's:  Intact  Cognition: WNL  Sleep:  Number of Hours: 8.25   Treatment Plan Summary: Daily contact with patient to assess and evaluate symptoms and progress in treatment and Medication management   Ms. Mamone is a 74 year old Caucasian female with no prior psychiatry history who presented to our emergency department with persecutory delusions and hallucinations.  1. Psychosis unspecified: All basic blood work including CBC, compressive metabolic panel, UA, TSH and urine toxicology screen were within normal limits. Patient was not intoxicated as her alcohol level was below the detection limit. Patient has been is started on Risperdal.   2. B12 deficiency. B-12 level was < 300 and will give B-12 injection as it may be contributing to problems with confusion and memory. She will get 3 days of B12 injections and then po daily  3. History of depression and anxiety: The patient was switched from Prozac to Zoloft but had problems with explosive diarrhea on the Zoloft. We switched to Celexa 10 mg daily with a plan to titrate up as tolerated and needed.  4. Dyslipidemia: Patient is treated with Lopid 600 mg by mouth twice a day and fenofibrate 54 mg a day.  5. GERD: We continued Protonix 40 mg a day  6. Vitamin D deficiency: Patient will be continued on vitamin D 50,000 units  7. Poor oral intake: She gas glucerna tid and will push fluids  orally.  8. Diabetes. We discontinued metformin and Amaryl. We continue ADA Diet and SSI.   9. Diarrhea. Resolved.   10. Metabolic syndrome. Cholesterol and TG are elevated, HbA1c 7.4.  11. Labs: ammonia level, RPR, HIV, all non reactive /wnl.  12. Imaging: Brain MRI, IMPRESSION: 1. No acute infarct. 2. Mild chronic small vessel ischemic disease. 3. 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 4. 1.1 cm subacute hemorrhage in the inferior right frontal lobe. Underlying cavernoma not excluded.  NEUROLOGY CONSULT IS GREATLY APPRECIATED.  13. Disposition. TBE. Collateral information was obtained from the patient's son phone number [phone]. Pervis Hocking Uliana Brinker 03/23/2015, 8:05 PM

## 2015-03-23 NOTE — Plan of Care (Signed)
Problem: Alteration in thought process Goal: LTG-Patient behavior demonstrates decreased signs psychosis (Patient behavior demonstrates decreased signs of psychosis to the point the patient is safe to return home and continue treatment in an outpatient setting.)  Outcome: Progressing Pt does not appear to be responding to internal stimuli and able to appropriately answer questions.
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
## 2015-03-23 NOTE — [facility] Group Notes (Signed)
[facility] Group Notes:  (Nursing/MHT/Case Management/Adjunct)  Date:  03/23/2015  Time:  11:45 AM  Type of Therapy:  Psychoeducational Skills  Participation Level:  Active  Participation Quality:  Appropriate, Attentive and Sharing  Affect:  Appropriate  Cognitive:  Alert and Appropriate  Insight:  Appropriate and Good  Engagement in Group:  Engaged  Modes of Intervention:  Discussion, Education and Support  Summary of Progress/Problems:  Laurie SmokeCara Morton Laurie Morton 03/23/2015, 11:45 AM

## 2015-03-24 LAB — GLUCOSE, CAPILLARY
Glucose-Capillary: 105 mg/dL — ABNORMAL HIGH (ref 65–99)
Glucose-Capillary: 106 mg/dL — ABNORMAL HIGH (ref 65–99)
Glucose-Capillary: 185 mg/dL — ABNORMAL HIGH (ref 65–99)
Glucose-Capillary: 154 mg/dL — ABNORMAL HIGH (ref 65–99)

## 2015-03-24 NOTE — Progress Notes (Signed)
Pt has been pleasant and cooperative.Pt denies SI and A/V hallucinations. Pt has attended some unit activities. Pt states she feels better today. Pt's mood and affect has been a little  brighter.

## 2015-03-24 NOTE — Progress Notes (Signed)
[facility]  03/24/2015 12:43 PM Ardine EngCarolyn H Bowie  MRN:  [id number]  Subjective:  Patient interviewed. Chart reviewed. Patient states that she is here simply because she made a "mistake" and pulled a fire alarm. Patient has minimal insight into mental problems but does admit she's had some trouble with her memory. She states that her mood is feeling stable. She denies any suicidal or homicidal ideation. She is not showing agitated behavior and is tolerating medicine well. Patient has concerns about her blood pressure and blood sugar but these appear to be treated appropriately. Disposition has remained a little up in the air.  Principal Problem: Psychosis in elderly with behavioral disturbance Diagnosis:   Patient Active Problem List   Diagnosis Date Noted  . B12 deficiency [E53.8] 03/18/2015  . Diabetes (HCC) [E11.9] 03/14/2015  . Essential hypertension [I10] 03/14/2015  . Dyslipidemia [E78.5] 03/14/2015  . Hearing loss [H91.90] 03/14/2015  . GERD (gastroesophageal reflux disease) [K21.9] 03/14/2015  . Vitamin D deficiency [E55.9] 03/14/2015  . 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 Diabetes mellitus without complication (HCC)   . Hypertension    History reviewed. No pertinent past surgical history. Family History: History reviewed. No pertinent family history. Family Psychiatric  History: see H&P Social History:  History  Alcohol Use No     History  Drug Use Not on file    Social History   Social History  . Marital Status: Single    Spouse Name: N/A  . Number of Children: N/A  . Years of Education: N/A   Social History Main Topics  . Smoking status: Never Smoker   . Smokeless tobacco: None  . Alcohol Use: No  . Drug Use: None  . Sexual Activity: Not Asked   Other Topics Concern   . None   Social History Narrative   Additional Social History:                         Sleep: Fair  Appetite:  Fair  Current Medications: Current Facility-Administered Medications  Medication Dose Route Frequency Provider Last Rate Last Dose  . acetaminophen (TYLENOL) tablet 650 mg  650 mg Oral Q6H PRN Jimmy FootmanAndrea Hernandez-Gonzalez, MD      . alum & mag hydroxide-simeth (MAALOX/MYLANTA) 200-200-20 MG/5ML suspension 30 mL  30 mL Oral Q4H PRN Jimmy FootmanAndrea Hernandez-Gonzalez, MD      . citalopram (CELEXA) tablet 10 mg  10 mg Oral Daily Darliss RidgelAarti K Kapur, MD   10 mg at 03/24/15 0836  . cyanocobalamin tablet 1,000 mcg  1,000 mcg Oral Daily Darliss RidgelAarti K Kapur, MD   1,000 mcg at 03/24/15 [id number]  . feeding supplement (GLUCERNA SHAKE) (GLUCERNA SHAKE) liquid 237 mL  237 mL Oral TID BM Jimmy FootmanAndrea Hernandez-Gonzalez, MD   237 mL at 03/24/15 1237  . fenofibrate tablet 54 mg  54 mg Oral Daily Jimmy FootmanAndrea Hernandez-Gonzalez, MD   54 mg at 03/24/15 0835  . fluticasone (FLONASE) 50 MCG/ACT nasal spray 2 spray  2 spray Each Nare Daily Jimmy FootmanAndrea Hernandez-Gonzalez, MD   2 spray at 03/24/15 [phone]  . gemfibrozil (LOPID) tablet 600 mg  600 mg Oral BID AC Jimmy FootmanAndrea Hernandez-Gonzalez, MD   600 mg at 03/24/15 0834  . insulin aspart (novoLOG) injection 0-15 Units  0-15 Units Subcutaneous TID WC Shari ProwsJolanta B Pucilowska, MD  3 Units at 03/23/15 1646  . loperamide (IMODIUM) capsule 2 mg  2 mg Oral TID PRN Darliss Ridgel, MD   2 mg at 03/18/15 1001  . LORazepam (ATIVAN) tablet 0.25 mg  0.25 mg Oral Q6H PRN Jimmy Footman, MD   0.25 mg at 03/23/15 2159  . magnesium hydroxide (MILK OF MAGNESIA) suspension 30 mL  30 mL Oral Daily PRN Jimmy Footman, MD      . pantoprazole (PROTONIX) EC tablet 40 mg  40 mg Oral Daily Jimmy Footman, MD   40 mg at 03/24/15 0835  . risperiDONE (RISPERDAL) tablet 3 mg  3 mg Oral QHS Shari Prows, MD   3 mg at 03/23/15 2159  . Vitamin D (Ergocalciferol) (DRISDOL) capsule 50,000  Units  50,000 Units Oral Weekly Brandy Hale, MD   50,000 Units at 03/20/15 0801    Lab Results:  Results for orders placed or performed during the hospital encounter of 03/13/15 (from the past 48 hour(s))  Glucose, capillary     Status: Abnormal   Collection Time: 03/22/15  5:03 PM  Result Value Ref Range   Glucose-Capillary 116 (H) 65 - 99 mg/dL  Glucose, capillary     Status: Abnormal   Collection Time: 03/22/15  8:40 PM  Result Value Ref Range   Glucose-Capillary 56 (L) 65 - 99 mg/dL  Glucose, capillary     Status: None   Collection Time: 03/22/15  9:10 PM  Result Value Ref Range   Glucose-Capillary 68 65 - 99 mg/dL  Glucose, capillary     Status: None   Collection Time: 03/23/15  6:50 AM  Result Value Ref Range   Glucose-Capillary 75 65 - 99 mg/dL  Glucose, capillary     Status: Abnormal   Collection Time: 03/23/15 11:37 AM  Result Value Ref Range   Glucose-Capillary 206 (H) 65 - 99 mg/dL   Comment 1 Notify RN   Glucose, capillary     Status: Abnormal   Collection Time: 03/23/15  4:18 PM  Result Value Ref Range   Glucose-Capillary 189 (H) 65 - 99 mg/dL   Comment 1 Notify RN   Glucose, capillary     Status: Abnormal   Collection Time: 03/23/15  9:16 PM  Result Value Ref Range   Glucose-Capillary 170 (H) 65 - 99 mg/dL  Glucose, capillary     Status: Abnormal   Collection Time: 03/24/15  7:00 AM  Result Value Ref Range   Glucose-Capillary 105 (H) 65 - 99 mg/dL  Glucose, capillary     Status: Abnormal   Collection Time: 03/24/15 11:56 AM  Result Value Ref Range   Glucose-Capillary 106 (H) 65 - 99 mg/dL   Comment 1 Notify RN     Physical Findings: AIMS:  , ,  ,  ,    CIWA:    COWS:     Musculoskeletal: Strength & Muscle Tone: within normal limits Gait & Station: normal Patient leans: N/A  Psychiatric Specialty Exam: Review of Systems  Constitutional: Negative.   HENT: Negative.   Eyes: Negative.   Respiratory: Negative.   Cardiovascular: Negative.    Gastrointestinal: Negative.   Musculoskeletal: Negative.   Skin: Negative.   Neurological: Negative.   Psychiatric/Behavioral: Positive for memory loss. Negative for depression, suicidal ideas, hallucinations and substance abuse. The patient is not nervous/anxious and does not have insomnia.   All other systems reviewed and are negative.   Blood pressure 104/62, pulse 78, temperature 97.7 F (36.5 C), temperature source Oral, resp. rate  20, height  (1.626 m), weight 54.885 kg (121 lb), SpO2 100 %.Body mass index is 20.76 kg/(m^2).  General Appearance: Casual  Eye Contact::  Good  Speech:  Clear and Coherent  Volume:  Normal  Mood:  Anxious  Affect:  Appropriate  Thought Process:  Goal Directed  Orientation:  Full (Time, Place, and Person)  Thought Content:  Delusions, Hallucinations: Auditory Visual and Paranoid Ideation  Suicidal Thoughts:  No  Homicidal Thoughts:  No  Memory:  Immediate;   Poor Recent;   Poor Remote;   Poor  Judgement:  Poor  Insight:  Lacking  Psychomotor Activity:  Normal  Concentration:  Fair  Recall:  [employer] of Knowledge:Fair  Language: Fair  Akathisia:  No  Handed:  Right  AIMS (if indicated):     Assets:  Communication Skills Desire for Improvement Financial Resources/Insurance Housing Resilience Social Support  ADL's:  Intact  Cognition: WNL  Sleep:  Number of Hours: 6.5   Treatment Plan Summary: Daily contact with patient to assess and evaluate symptoms and progress in treatment and Medication management   Ms. Istre is a 74 year old Caucasian female with no prior psychiatry history who presented to our emergency department with persecutory delusions and hallucinations.  1. Psychosis unspecified: All basic blood work including CBC, compressive metabolic panel, UA, TSH and urine toxicology screen were within normal limits. Patient was not intoxicated as her alcohol level was below the detection limit. Patient has been is started on  Risperdal. Patient appears to shown improvement in that. Still has limited insight but is not making psychotic statements or showing evidence of hallucinations.  2. B12 deficiency. B-12 level was < 300 and will give B-12 injection as it may be contributing to problems with confusion and memory. She will get 3 days of B12 injections and then po daily  3. History of depression and anxiety: The patient was switched from Prozac to Zoloft but had problems with explosive diarrhea on the Zoloft. We switched to Celexa 10 mg daily with a plan to titrate up as tolerated and needed. Mood is reported as better. Denies suicidal thoughts. Continue current management with low dose of Celexa  4. Dyslipidemia: Patient is treated with Lopid 600 mg by mouth twice a day and fenofibrate 54 mg a day.  5. GERD: We continued Protonix 40 mg a day  6. Vitamin D deficiency: Patient will be continued on vitamin D 50,000 units  7. Poor oral intake: She gas glucerna tid and will push fluids orally.  8. Diabetes. We discontinued metformin and Amaryl. We continue ADA Diet and SSI. Blood sugars are staying under good control  9. Diarrhea. Resolved.   10. Metabolic syndrome. Cholesterol and TG are elevated, HbA1c 7.4.  11. Labs: ammonia level, RPR, HIV, all non reactive /wnl.  12. Imaging: Brain MRI, IMPRESSION: 1. No acute infarct. 2. Mild chronic small vessel ischemic disease. 3. 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 4. 1.1 cm subacute hemorrhage in the inferior right frontal lobe. Underlying cavernoma not excluded. Neurology consult reviewed. Neurology does not feel that there is a contribution of her  NEUROLOGY CONSULT IS GREATLY APPRECIATED.  13. Disposition. TBE. Collateral information was obtained from the patient's son phone number [phone]. Apolinar Junes. Social work is still working on an [employer].    Mairead Schwarzkopf 03/24/2015, 12:43 PM

## 2015-03-25 LAB — GLUCOSE, CAPILLARY
Glucose-Capillary: 145 mg/dL — ABNORMAL HIGH (ref 65–99)
Glucose-Capillary: 164 mg/dL — ABNORMAL HIGH (ref 65–99)
Glucose-Capillary: 109 mg/dL — ABNORMAL HIGH (ref 65–99)

## 2015-03-25 NOTE — Plan of Care (Signed)
Problem: Alteration in thought process Goal: LTG-Patient behavior demonstrates decreased signs psychosis (Patient behavior demonstrates decreased signs of psychosis to the point the patient is safe to return home and continue treatment in an outpatient setting.)  Outcome: Progressing Pt does not appear to be responding to internal stimuli. Denies AVH

## 2015-03-25 NOTE — Progress Notes (Signed)
[facility] MD Progress Note  03/25/2015 3:43 PM Laurie Morton  MRN:  [id number]  Subjective:  Laurie Morton feels better today. She has no complaints of depression, anxiety, or psychosis. Her thoughts seem better organized and she is no longer paranoid. She still refuses to go to assisted living facility which her family plans for her. She tolerates medications well. She participates in programming. There are no unwanted behaviors.   Principal Problem: Psychosis in elderly with behavioral disturbance Diagnosis:   Patient Active Problem List   Diagnosis Date Noted  . B12 deficiency [E53.8] 03/18/2015  . Diabetes (HCC) [E11.9] 03/14/2015  . Essential hypertension [I10] 03/14/2015  . Dyslipidemia [E78.5] 03/14/2015  . Hearing loss [H91.90] 03/14/2015  . GERD (gastroesophageal reflux disease) [K21.9] 03/14/2015  . Vitamin D deficiency [E55.9] 03/14/2015  . 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 Diabetes mellitus without complication (HCC)   . Hypertension    History reviewed. No pertinent past surgical history. Family History: History reviewed. No pertinent family history. Family Psychiatric  History: See H&P.  Social History:  History  Alcohol Use No     History  Drug Use Not on file    Social History   Social History  . Marital Status: Single    Spouse Name: N/A  . Number of Children: N/A  . Years of Education: N/A   Social History Main Topics  . Smoking status: Never Smoker   . Smokeless tobacco: None  . Alcohol Use: No  . Drug Use: None  . Sexual Activity: Not Asked   Other Topics Concern  . None   Social History Narrative   Additional Social History:                         Sleep: Fair  Appetite:  Fair  Current Medications: Current Facility-Administered Medications  Medication  Dose Route Frequency Provider Last Rate Last Dose  . acetaminophen (TYLENOL) tablet 650 mg  650 mg Oral Q6H PRN Jimmy Footman, MD   650 mg at 03/25/15 0651  . alum & mag hydroxide-simeth (MAALOX/MYLANTA) 200-200-20 MG/5ML suspension 30 mL  30 mL Oral Q4H PRN Jimmy Footman, MD      . citalopram (CELEXA) tablet 10 mg  10 mg Oral Daily Darliss Ridgel, MD   10 mg at 03/25/15 0806  . cyanocobalamin tablet 1,000 mcg  1,000 mcg Oral Daily Darliss Ridgel, MD   1,000 mcg at 03/25/15 0804  . feeding supplement (GLUCERNA SHAKE) (GLUCERNA SHAKE) liquid 237 mL  237 mL Oral TID BM Jimmy Footman, MD   237 mL at 03/25/15 1204  . fenofibrate tablet 54 mg  54 mg Oral Daily Jimmy Footman, MD   54 mg at 03/25/15 0805  . fluticasone (FLONASE) 50 MCG/ACT nasal spray 2 spray  2 spray Each Nare Daily Jimmy Footman, MD   2 spray at 03/25/15 0808  . gemfibrozil (LOPID) tablet 600 mg  600 mg Oral BID AC Jimmy Footman, MD   600 mg at 03/25/15 0805  . insulin aspart (novoLOG) injection 0-15 Units  0-15 Units Subcutaneous TID WC Shari Prows, MD   3 Units at 03/25/15 1206  . loperamide (IMODIUM) capsule 2 mg  2 mg Oral TID PRN Darliss Ridgel, MD   2 mg at 03/18/15 1001  . LORazepam (ATIVAN) tablet  0.25 mg  0.25 mg Oral Q6H PRN Jimmy Footman, MD   0.25 mg at 03/23/15 2159  . magnesium hydroxide (MILK OF MAGNESIA) suspension 30 mL  30 mL Oral Daily PRN Jimmy Footman, MD      . pantoprazole (PROTONIX) EC tablet 40 mg  40 mg Oral Daily Jimmy Footman, MD   40 mg at 03/25/15 0807  . risperiDONE (RISPERDAL) tablet 3 mg  3 mg Oral QHS Shari Prows, MD   3 mg at 03/24/15 2215  . Vitamin D (Ergocalciferol) (DRISDOL) capsule 50,000 Units  50,000 Units Oral Weekly Brandy Hale, MD   50,000 Units at 03/20/15 0801    Lab Results:  Results for orders placed or performed during the hospital encounter of 03/13/15 (from the  past 48 hour(s))  Glucose, capillary     Status: Abnormal   Collection Time: 03/23/15  4:18 PM  Result Value Ref Range   Glucose-Capillary 189 (H) 65 - 99 mg/dL   Comment 1 Notify RN   Glucose, capillary     Status: Abnormal   Collection Time: 03/23/15  9:16 PM  Result Value Ref Range   Glucose-Capillary 170 (H) 65 - 99 mg/dL  Glucose, capillary     Status: Abnormal   Collection Time: 03/24/15  7:00 AM  Result Value Ref Range   Glucose-Capillary 105 (H) 65 - 99 mg/dL  Glucose, capillary     Status: Abnormal   Collection Time: 03/24/15 11:56 AM  Result Value Ref Range   Glucose-Capillary 106 (H) 65 - 99 mg/dL   Comment 1 Notify RN   Glucose, capillary     Status: Abnormal   Collection Time: 03/24/15  4:36 PM  Result Value Ref Range   Glucose-Capillary 185 (H) 65 - 99 mg/dL   Comment 1 Notify RN   Glucose, capillary     Status: Abnormal   Collection Time: 03/24/15  8:42 PM  Result Value Ref Range   Glucose-Capillary 154 (H) 65 - 99 mg/dL  Glucose, capillary     Status: Abnormal   Collection Time: 03/25/15  6:42 AM  Result Value Ref Range   Glucose-Capillary 145 (H) 65 - 99 mg/dL  Glucose, capillary     Status: Abnormal   Collection Time: 03/25/15 12:04 PM  Result Value Ref Range   Glucose-Capillary 164 (H) 65 - 99 mg/dL    Physical Findings: AIMS:  , ,  ,  ,    CIWA:    COWS:     Musculoskeletal: Strength & Muscle Tone: within normal limits Gait & Station: normal Patient leans: N/A  Psychiatric Specialty Exam: Review of Systems  All other systems reviewed and are negative.   Blood pressure 126/65, pulse 89, temperature 97.6 F (36.4 C), temperature source Oral, resp. rate 20, height 5\' 4"  (1.626 m), weight 54.885 kg (121 lb), SpO2 100 %.Body mass index is 20.76 kg/(m^2).  General Appearance: Casual  Eye Contact::  Good  Speech:  Clear and Coherent  Volume:  Normal  Mood:  Anxious  Affect:  Appropriate  Thought Process:  Goal Directed  Orientation:  Full  (Time, Place, and Person)  Thought Content:  Delusions and Paranoid Ideation  Suicidal Thoughts:  No  Homicidal Thoughts:  No  Memory:  Immediate;   Fair Recent;   Fair Remote;   Fair  Judgement:  Impaired  Insight:  Shallow  Psychomotor Activity:  Normal  Concentration:  Fair  Recall:  [employer] of Knowledge:Fair  Language: Fair  Akathisia:  No  Handed:  Right  AIMS (if indicated):     Assets:  Communication Skills Desire for Improvement Financial Resources/Insurance Housing Physical Health Resilience Social Support  ADL's:  Intact  Cognition: WNL  Sleep:  Number of Hours: 6   Treatment Plan Summary: Daily contact with patient to assess and evaluate symptoms and progress in treatment and Medication management   Laurie Morton is a 75 year old Caucasian female with no prior psychiatry history who presented to our emergency department with persecutory delusions and hallucinations.  1. Psychosis unspecified: All basic blood work including CBC, compressive metabolic panel, UA, TSH and urine toxicology screen were within normal limits. Patient was not intoxicated as her alcohol level was below the detection limit. Patient has been is started on Risperdal.   2. B12 deficiency. B-12 level was < 300 and will give B-12 injection as it may be contributing to problems with confusion and memory. She will get 3 days of B12 injections and then 1000mcg po daily  3. History of depression and anxiety: The patient was switched from Prozac to Zoloft but had problems with explosive diarrhea on the Zoloft. We switched to Celexa 10 mg daily with a plan to titrate up as tolerated and needed.  4. Dyslipidemia: Patient is treated with Lopid 600 mg by mouth twice a day and fenofibrate 54 mg a day.  5. GERD: We continued Protonix 40 mg a day  6. Vitamin D deficiency: Patient will be continued on vitamin D 50,000 units  7. Poor oral intake: She gas glucerna tid and will push fluids orally.  8.  Diabetes. We discontinued metformin and Amaryl. We continue ADA Diet and SSI.   9. Diarrhea. Resolved.   10. Metabolic syndrome. Cholesterol and TG are elevated, HbA1c 7.4.  11. Labs: ammonia level, RPR, HIV, all non reactive /wnl.  12. Imaging: Brain MRI, IMPRESSION: 1. No acute infarct. 2. Mild chronic small vessel ischemic disease. 3. 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 4. 1.1 cm subacute hemorrhage in the inferior right frontal lobe. Underlying cavernoma not excluded.  NEUROLOGY CONSULT IS GREATLY APPRECIATED.  13. Disposition. TBE. Collateral information was obtained from the patient's son phone number [phone]. Pervis HockingBrandon   Zlata Alcaide 03/25/2015, 3:43 PM

## 2015-03-25 NOTE — Plan of Care (Signed)
Problem: Alteration in thought process Goal: STG-Patient does not respond to command hallucinations Outcome: Progressing Patient not noted to be responding to hallucinations on shift.

## 2015-03-25 NOTE — Progress Notes (Signed)
Laurie JonesCarolyn has been cooperative with treatment, she came to medication room for meds and was compliant. She remains anxious and apprehensive on approach. She was visible in the milieu, no behavioral issues to report on shift at this time. Will continue to monitor patient's behavior.

## 2015-03-25 NOTE — Progress Notes (Signed)
Pt has been pleasant and cooperative.Pt denies SI and A/V hallucinations. Pt has attended some unit activities. Pt states she feels better today. Pt's mood and affect has been  brighter. Pt continues to be active on the unit. No inappropriate behaviors noted.

## 2015-03-25 NOTE — Progress Notes (Signed)
D: Pt is pleasant and cooperative this shift. Denies SI/HI/AVH at this time. Denies pain. Pt is seen in milieu interacting with peers. Pt is slightly hard of hearing. No concerns or complaints at this time. A: Emotional support and encouragement provided. Medications administered as prescribed. q15 minute safety checks maintained. R: Pt remains free from harm.

## 2015-03-26 LAB — GLUCOSE, CAPILLARY
Glucose-Capillary: 100 mg/dL — ABNORMAL HIGH (ref 65–99)
Glucose-Capillary: 134 mg/dL — ABNORMAL HIGH (ref 65–99)
Glucose-Capillary: 160 mg/dL — ABNORMAL HIGH (ref 65–99)
Glucose-Capillary: 211 mg/dL — ABNORMAL HIGH (ref 65–99)
Glucose-Capillary: 217 mg/dL — ABNORMAL HIGH (ref 65–99)

## 2015-03-26 LAB — COMPREHENSIVE METABOLIC PANEL
ALBUMIN: 4.1 g/dL (ref 3.5–5.0)
ALK PHOS: 61 U/L (ref 38–126)
ALT: 20 U/L (ref 14–54)
AST: 46 U/L — AB (ref 15–41)
Anion gap: 8 (ref 5–15)
BUN: 33 mg/dL — AB (ref 6–20)
CHLORIDE: 100 mmol/L — AB (ref 101–111)
CO2: 28 mmol/L (ref 22–32)
CREATININE: 1.24 mg/dL — AB (ref 0.44–1.00)
Calcium: 10.2 mg/dL (ref 8.9–10.3)
GFR calc Af Amer: 49 mL/min — ABNORMAL LOW (ref >60)
GFR calc non Af Amer: 42 mL/min — ABNORMAL LOW (ref >60)
GLUCOSE: 266 mg/dL — AB (ref 65–99)
Potassium: 4.3 mmol/L (ref 3.5–5.1)
SODIUM: 136 mmol/L (ref 135–145)
Total Bilirubin: 0.5 mg/dL (ref 0.3–1.2)
Total Protein: 7.7 g/dL (ref 6.5–8.1)

## 2015-03-26 MED ORDER — HYDROXYZINE HCL 50 MG PO TABS: 50.0000 mg | ORAL_TABLET | Freq: Every evening | ORAL | Status: DC | PRN

## 2015-03-26 MED ORDER — METFORMIN HCL ER 500 MG PO TB24
1000.0000 mg | ORAL_TABLET | Freq: Two times a day (BID) | ORAL | Status: DC
  Administered 2015-03-26: 1000 mg via ORAL
  Filled 2015-03-26: qty 2

## 2015-03-26 MED ORDER — GLIMEPIRIDE 2 MG PO TABS
4.0000 mg | ORAL_TABLET | Freq: Every day | ORAL | Status: DC
  Administered 2015-03-26: 4 mg via ORAL
  Filled 2015-03-26: qty 2

## 2015-03-26 MED ORDER — GLIMEPIRIDE 2 MG PO TABS: 4.0000 mg | ORAL_TABLET | Freq: Every day | ORAL | Status: DC

## 2015-03-26 NOTE — [facility] Group Notes (Signed)
[facility]   03/26/2015 9:15 AM  ?  Participation Quality: Alert, Appropriate and Oriented   Mood/Affect: Appropriate  Depression Rating: 0  Anxiety Rating: 0  Thoughts of Suicide: Pt denies SI/HI   Will you contract for safety? Yes   Current AVH: Pt denies   Plan for Discharge/Comments: Pt attended discharge planning group and actively participated in group. CSW provided pt with today's workbook. Pt shared she believes that someone was using her insurance card for their own benefit, but did not fully explain after prompts from the CSW.  Pt presented as irritated in group, in regards to her not [facility], but was polite and cooperative with the CSW and other group members and focused and attentive to the topics discussed and the sharing of others.   Transportation Means: Pt reports access to transportation   Supports: Pt lists her supports as her sons     Dorothe PeaJonathan F. Sky Borboa, MSW, LCSWA, LCAS

## 2015-03-26 NOTE — [facility] Group Notes (Signed)
[facility] Group Notes:  (Nursing/MHT/Case Management/Adjunct)  Date:  03/26/2015  Time:  12:12 PM  Type of Therapy:  Psychoeducational Skills  Participation Level:  Active  Participation Quality:  Appropriate  Affect:  Appropriate and Depressed  Cognitive:  Appropriate  Insight:  Appropriate  Engagement in Group:  Engaged  Modes of Intervention:  Discussion and Education  Summary of Progress/Problems:  [name] 03/26/2015, 12:12 PM

## 2015-03-26 NOTE — [facility] Group Notes (Signed)
[facility] LCSW Group Therapy   03/26/2015 1 pm Type of Therapy: Group Therapy   Participation Level: Active   Participation Quality: Attentive, Sharing and Supportive   Affect: Depressed and Flat   Cognitive: Alert and Oriented   Insight: Developing/Improving and Engaged   Engagement in Therapy: Developing/Improving and Engaged   Modes of Intervention: Clarification, Confrontation, Discussion, Education, Exploration,  Limit-setting, Orientation, Problem-solving, Rapport Building, Dance movement psychotherapisteality Testing, Socialization and Support   Summary of Progress/Problems: Pt identified obstacles faced currently and processed barriers involved in overcoming these obstacles. Pt identified steps necessary for overcoming these obstacles and explored motivation (internal and external) for facing these difficulties head on. Pt further identified one area of concern in their lives and chose a goal to focus on for today. Pt shared at length her desire to be discharged and that she could not understand why she needed to continue to stay at the hospital.  Pt shared she now feels that this experience led her to a very "better place" and that finally, she found herself feeling better. Pt shared she enjoys not feeling alone anymore and that she enjoys her ability to be given medication until she feels she is stabilized while at the hospital.Pt shared with the group how she is very shy and that being in group has really helped her and that she intends to keep attending group upon discharge.  Pt was polite and cooperative with the CSW and other group members and focused and attentive to the topics discussed and the sharing of others.  Dorothe PeaJonathan F. Maleaha Hughett, LCSWA, LCAS  03/26/15

## 2015-03-26 NOTE — Progress Notes (Signed)
Denies depression,suicidal or homicidal ideation and AV hallucination.Pleasant & cooperative on approach.Appropriate with staff & peers.Visible in the milieu.Attended groups.Compliant with medications.

## 2015-03-26 NOTE — Progress Notes (Signed)
[facility] MD Progress Note  03/26/2015 2:06 PM CHRISANDRA WIEMERS  MRN:  [id number]  Subjective:  Ms. Claw feels better today. She has no complaints of depression, anxiety, or psychosis. Her thoughts seem better organized and she is no longer paranoid. She still refuses to go to assisted living facility which her family plans for her. She tolerates medications well. She participates in programming. There are no unwanted behaviors.   Principal Problem: Psychosis in elderly with behavioral disturbance Diagnosis:   Patient Active Problem List   Diagnosis Date Noted  . B12 deficiency [E53.8] 03/18/2015  . Diabetes ([place]) [E11.9] 03/14/2015  . Essential hypertension [I10] 03/14/2015  . Dyslipidemia [E78.5] 03/14/2015  . Hearing loss [H91.90] 03/14/2015  . GERD (gastroesophageal reflux disease) [K21.9] 03/14/2015  . Vitamin D deficiency [E55.9] 03/14/2015  . 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 Diabetes mellitus without complication ([place])   . Hypertension    History reviewed. No pertinent past surgical history. Family History: History reviewed. No pertinent family history. Family Psychiatric  History: See H&P.  Social History:  History  Alcohol Use No     History  Drug Use Not on file    Social History   Social History  . Marital Status: Single    Spouse Name: N/A  . Number of Children: N/A  . Years of Education: N/A   Social History Main Topics  . Smoking status: Never Smoker   . Smokeless tobacco: None  . Alcohol Use: No  . Drug Use: None  . Sexual Activity: Not Asked   Other Topics Concern  . None   Social History Narrative      Sleep: Good  Appetite:  Good  Current Medications: Current Facility-Administered Medications  Medication Dose Route Frequency Provider Last Rate Last Dose  .  acetaminophen (TYLENOL) tablet 650 mg  650 mg Oral Q6H PRN Hildred Priest, MD   650 mg at 03/25/15 0651  . alum & mag hydroxide-simeth (MAALOX/MYLANTA) 200-200-20 MG/5ML suspension 30 mL  30 mL Oral Q4H PRN Hildred Priest, MD      . citalopram (CELEXA) tablet 10 mg  10 mg Oral Daily Chauncey Mann, MD   10 mg at 03/26/15 5638  . cyanocobalamin tablet 1,000 mcg  1,000 mcg Oral Daily Chauncey Mann, MD   1,000 mcg at 03/26/15 7564  . feeding supplement (GLUCERNA SHAKE) (GLUCERNA SHAKE) liquid 237 mL  237 mL Oral TID BM Hildred Priest, MD   237 mL at 03/26/15 1000  . fenofibrate tablet 54 mg  54 mg Oral Daily Hildred Priest, MD   54 mg at 03/26/15 3329  . fluticasone (FLONASE) 50 MCG/ACT nasal spray 2 spray  2 spray Each Nare Daily Hildred Priest, MD   2 spray at 03/26/15 [phone]  . gemfibrozil (LOPID) tablet 600 mg  600 mg Oral BID AC Hildred Priest, MD   600 mg at 03/26/15 0820  . glimepiride (AMARYL) tablet 4 mg  4 mg Oral Q breakfast Hildred Priest, MD   4 mg at 03/26/15 1256  . magnesium hydroxide (MILK OF MAGNESIA) suspension 30 mL  30 mL Oral Daily PRN Hildred Priest, MD      . metFORMIN (GLUCOPHAGE-XR) 24 hr tablet 1,000 mg  1,000 mg Oral BID WC Hildred Priest, MD      . pantoprazole (PROTONIX) EC tablet 40 mg  40  mg Oral Daily Hildred Priest, MD   40 mg at 03/26/15 7371  . risperiDONE (RISPERDAL) tablet 3 mg  3 mg Oral QHS Clovis Fredrickson, MD   3 mg at 03/25/15 2202  . Vitamin D (Ergocalciferol) (DRISDOL) capsule 50,000 Units  50,000 Units Oral Weekly Rainey Pines, MD   50,000 Units at 03/20/15 0801    Lab Results:  Results for orders placed or performed during the hospital encounter of 03/13/15 (from the past 48 hour(s))  Glucose, capillary     Status: Abnormal   Collection Time: 03/24/15  4:36 PM  Result Value Ref Range   Glucose-Capillary 185 (H) 65 - 99 mg/dL   Comment 1 Notify  RN   Glucose, capillary     Status: Abnormal   Collection Time: 03/24/15  8:42 PM  Result Value Ref Range   Glucose-Capillary 154 (H) 65 - 99 mg/dL  Glucose, capillary     Status: Abnormal   Collection Time: 03/25/15  6:42 AM  Result Value Ref Range   Glucose-Capillary 145 (H) 65 - 99 mg/dL  Glucose, capillary     Status: Abnormal   Collection Time: 03/25/15 12:04 PM  Result Value Ref Range   Glucose-Capillary 164 (H) 65 - 99 mg/dL  Glucose, capillary     Status: Abnormal   Collection Time: 03/25/15  4:30 PM  Result Value Ref Range   Glucose-Capillary 109 (H) 65 - 99 mg/dL  Glucose, capillary     Status: Abnormal   Collection Time: 03/26/15 12:48 AM  Result Value Ref Range   Glucose-Capillary 211 (H) 65 - 99 mg/dL  Glucose, capillary     Status: Abnormal   Collection Time: 03/26/15  6:47 AM  Result Value Ref Range   Glucose-Capillary 134 (H) 65 - 99 mg/dL  Glucose, capillary     Status: Abnormal   Collection Time: 03/26/15 11:54 AM  Result Value Ref Range   Glucose-Capillary 217 (H) 65 - 99 mg/dL   Comment 1 Notify RN   Comprehensive metabolic panel     Status: Abnormal   Collection Time: 03/26/15 12:53 PM  Result Value Ref Range   Sodium 136 135 - 145 mmol/L   Potassium 4.3 3.5 - 5.1 mmol/L   Chloride 100 (L) 101 - 111 mmol/L   CO2 28 22 - 32 mmol/L   Glucose, Bld 266 (H) 65 - 99 mg/dL   BUN 33 (H) 6 - 20 mg/dL   Creatinine, Ser 1.24 (H) 0.44 - 1.00 mg/dL   Calcium 10.2 8.9 - 10.3 mg/dL   Total Protein 7.7 6.5 - 8.1 g/dL   Albumin 4.1 3.5 - 5.0 g/dL   AST 46 (H) 15 - 41 U/L   ALT 20 14 - 54 U/L   Alkaline Phosphatase 61 38 - 126 U/L   Total Bilirubin 0.5 0.3 - 1.2 mg/dL   GFR calc non Af Amer 42 (L) >60 mL/min   GFR calc Af Amer 49 (L) >60 mL/min    Comment: (NOTE) The eGFR has been calculated using the CKD EPI equation. This calculation has not been validated in all clinical situations. eGFR's persistently <60 mL/min signify possible Chronic Kidney Disease.     Anion gap 8 5 - 15    Physical Findings: AIMS:  , ,  ,  ,    CIWA:    COWS:     Musculoskeletal: Strength & Muscle Tone: within normal limits Gait & Station: normal Patient leans: N/A  Psychiatric Specialty Exam: Review of Systems  Constitutional: Negative.   HENT: Negative.   Eyes: Negative.   Respiratory: Negative.   Cardiovascular: Negative.   Gastrointestinal: Negative.   Genitourinary: Negative.   Musculoskeletal: Negative.   Skin: Negative.   Neurological: Negative.   Endo/Heme/Allergies: Negative.   Psychiatric/Behavioral: Negative.     Blood pressure 105/65, pulse 76, temperature 97.9 F (36.6 C), temperature source Oral, resp. rate 20, height 5' 4"  (1.626 m), weight 54.885 kg (121 lb), SpO2 100 %.Body mass index is 20.76 kg/(m^2).  General Appearance: Casual  Eye Contact::  Good  Speech:  Clear and Coherent  Volume:  Normal  Mood:  Anxious  Affect:  Appropriate  Thought Process:  Goal Directed  Orientation:  Full (Time, Place, and Person)  Thought Content:  Delusions and Paranoid Ideation  Suicidal Thoughts:  No  Homicidal Thoughts:  No  Memory:  Immediate;   Fair Recent;   Fair Remote;   Fair  Judgement:  Impaired  Insight:  Shallow  Psychomotor Activity:  Normal  Concentration:  Fair  Recall:  [place]  Language: Fair  Akathisia:  No  Handed:  Right  AIMS (if indicated):     Assets:  Communication Skills Desire for Improvement Financial Resources/Insurance Housing Physical Health Resilience Social Support  ADL's:  Intact  Cognition: WNL  Sleep:  Number of Hours: 6.25   Treatment Plan Summary: Daily contact with patient to assess and evaluate symptoms and progress in treatment and Medication management   Ms. Bonsall is a 75 year old Caucasian female with no prior psychiatry history who presented to our emergency department with persecutory delusions and hallucinations.  1. Psychosis unspecified: All basic blood work  including CBC, compressive metabolic panel, UA, TSH and urine toxicology screen were within normal limits. Patient was not intoxicated as her alcohol level was below the detection limit. Patient has been is started on Risperdal. No longer delusional no longer paranoid. Tolerating well Risperdal.   2. B12 deficiency. B-12 level was < 300 and will give B-12 injection as it may be contributing to problems with confusion and memory. She received 3 days of B12 injections and now on  1059mg po daily  3. History of depression and anxiety: The patient was switched from Prozac to Zoloft but had problems with explosive diarrhea on the Zoloft. We switched to Celexa 10 mg daily with a plan to titrate up as tolerated and needed.  4. Dyslipidemia: Patient is treated with Lopid 600 mg by mouth twice a day and fenofibrate 54 mg a day.  5. GERD: We continued Protonix 40 mg a day  6. Vitamin D deficiency: Patient will be continued on vitamin D 50,000 units  7. Poor oral intake: She gas glucerna tid and will push fluids orally.  In the 100% of all her meals since yesterday.  8. Diabetes: Patient had one episode of hypoglycemia a few days ago. I we will continue Amaryl but I will decrease the dose from 8 mg a day to only 4 mg a day. I will restart metformin at 8000 mg by mouth twice a day. Capillary blood glucose will be checked 4 times a day today.    9. Diarrhea. Resolved.   10. Metabolic syndrome. Cholesterol and TG are elevated, HbA1c 7.4.  11. Labs: ammonia level, RPR, HIV, all non reactive /wnl.  12. Imaging: Brain MRI, IMPRESSION: 1. No acute infarct. 2. Mild chronic small vessel ischemic disease. 3. 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 4. 1.1  cm subacute hemorrhage in the inferior right frontal lobe. Underlying cavernoma not excluded.  NEUROLOGY CONSULT IS GREATLY APPRECIATED.  13. Disposition: plan to d/c back home  tomorrow  I will order CBC and compressive metabolic final tomorrow a.m.  I will discuss case with psychologist to see if the cognitive testing can be completed prior to discharge.   Hildred Priest 03/26/2015, 2:06 PM

## 2015-03-26 NOTE — [facility] Group Notes (Signed)
[facility] Group Notes:  (Nursing/MHT/Case Management/Adjunct)  Date:  03/26/2015  Time:  10:27 PM  Type of Therapy:  Group Therapy  Participation Level:  Active  Participation Quality:  Appropriate, Attentive and Sharing  Affect:  Appropriate  Cognitive:  Appropriate  Insight:  Appropriate  Engagement in Group:  Engaged  Modes of Intervention:  Support  Summary of Progress/Problems:  Laurie ReddenOlivia Briana Morton 03/26/2015, 10:27 PM

## 2015-03-27 ENCOUNTER — Encounter: Payer: Self-pay | Admitting: Psychiatry

## 2015-03-27 DIAGNOSIS — E854 Organ-limited amyloidosis: Secondary | ICD-10-CM

## 2015-03-27 DIAGNOSIS — I679 Cerebrovascular disease, unspecified: Secondary | ICD-10-CM

## 2015-03-27 DIAGNOSIS — F0281 Dementia in other diseases classified elsewhere with behavioral disturbance: Secondary | ICD-10-CM

## 2015-03-27 DIAGNOSIS — F03918 Unspecified dementia, unspecified severity, with other behavioral disturbance: Secondary | ICD-10-CM | POA: Insufficient documentation

## 2015-03-27 DIAGNOSIS — I680 Cerebral amyloid angiopathy: Secondary | ICD-10-CM

## 2015-03-27 DIAGNOSIS — F0391 Unspecified dementia with behavioral disturbance: Secondary | ICD-10-CM | POA: Insufficient documentation

## 2015-03-27 DIAGNOSIS — N189 Chronic kidney disease, unspecified: Secondary | ICD-10-CM

## 2015-03-27 DIAGNOSIS — F02B18 Dementia in other diseases classified elsewhere, moderate, with other behavioral disturbance: Secondary | ICD-10-CM

## 2015-03-27 LAB — CBC
HEMATOCRIT: 30.1 % — AB (ref 35.0–47.0)
Hemoglobin: 10.7 g/dL — ABNORMAL LOW (ref 12.0–16.0)
MCH: 31.8 pg (ref 26.0–34.0)
MCHC: 35.4 g/dL (ref 32.0–36.0)
MCV: 89.8 fL (ref 80.0–100.0)
PLATELETS: 161 10*3/uL (ref 150–440)
RBC: 3.35 MIL/uL — AB (ref 3.80–5.20)
RDW: 13.6 % (ref 11.5–14.5)
WBC: 3.9 10*3/uL (ref 3.6–11.0)

## 2015-03-27 LAB — GLUCOSE, CAPILLARY
Glucose-Capillary: 58 mg/dL — ABNORMAL LOW (ref 65–99)
Glucose-Capillary: 90 mg/dL (ref 65–99)
Glucose-Capillary: 252 mg/dL — ABNORMAL HIGH (ref 65–99)
Glucose-Capillary: 123 mg/dL — ABNORMAL HIGH (ref 65–99)

## 2015-03-27 MED ORDER — RISPERIDONE 3 MG PO TABS: 3.0000 mg | ORAL_TABLET | Freq: Every day | ORAL | Status: DC

## 2015-03-27 MED ORDER — CYANOCOBALAMIN 1000 MCG PO TABS: 1000.0000 ug | ORAL_TABLET | Freq: Every day | ORAL | Status: AC

## 2015-03-27 MED ORDER — GLIMEPIRIDE 2 MG PO TABS: 2.0000 mg | ORAL_TABLET | Freq: Every day | ORAL | Status: AC

## 2015-03-27 MED ORDER — CITALOPRAM HYDROBROMIDE 10 MG PO TABS: 10.0000 mg | ORAL_TABLET | Freq: Every day | ORAL | Status: AC

## 2015-03-27 MED ORDER — GLIMEPIRIDE 2 MG PO TABS
2.0000 mg | ORAL_TABLET | Freq: Every day | ORAL | Status: DC
  Administered 2015-03-28: 2 mg via ORAL
  Filled 2015-03-27: qty 1

## 2015-03-27 NOTE — Progress Notes (Signed)
D:Patient aware of discharge this shift . Patient returning home . Patient received all belonging locked up . Patient denies  Suicidal  And homicidal ideations  .  A: Writer instructed on discharge criteria   Prescriptions faxed to pharmacy.    . Aware  Of follow up appointment . R: Patient  waiting on her eson

## 2015-03-27 NOTE — [facility] Group Notes (Signed)
[facility] LCSW Group Therapy   03/27/2015 11:00 am  Type of Therapy: Group Therapy   Participation Level: Active   Participation Quality: Attentive, Sharing and Supportive   Affect: Appropriate  Cognitive: Alert and Oriented   Insight: Developing/Improving and Engaged   Engagement in Therapy: Developing/Improving and Engaged   Modes of Intervention: Clarification, Confrontation, Discussion, Education, Exploration,  Limit-setting, Orientation, Problem-solving, Rapport Building, Dance movement psychotherapisteality Testing, Socialization and Support  Summary of Progress/Problems: The topic for group therapy was feelings about diagnosis. Pt actively participated in group discussion on their past and current diagnosis and how they feel towards this. Pt also identified how society and family members judge them, based on their diagnosis as well as stereotypes and stigmas.  Pt shared with the group she has made significant progress since being admitted.   Pt feels her recovery in the past was always hindered by her thoughts and feelings of what others wanted for her, rather than what she wanted for herself.  Pt shared that today, although she cares cares about the stigma of her mental illness, she will keep moving forward once discharged even if things are difficult, with therapy and medication managment it if it leads to a successful recovery.     Laurie PeaJonathan F. Jaqwon Morton, LCSWA, LCAS

## 2015-03-27 NOTE — Plan of Care (Signed)
Problem: Ineffective individual coping Goal: STG: Pt will be able to identify effective and ineffective STG: Pt will be able to identify effective and ineffective coping patterns  Outcome: Progressing Pt identifies coping patterns she has learning during groups while here on the unit.  Problem: Alteration in thought process Goal: STG-Patient is able to discuss thoughts with staff Outcome: Progressing Pt discusses thoughts regarding current admission and what she will focus on after discharge.

## 2015-03-27 NOTE — Progress Notes (Addendum)
[facility] MD Progress Note  03/27/2015 10:31 PM Laurie Morton  MRN:  [id number]  Subjective:  Laurie Morton feels better today. She has no complaints of depression, anxiety, or psychosis. Her thoughts seem better organized and she is no longer paranoid. She still refuses to go to assisted living facility which her family plans for her. She tolerates medications well. She participates in programming. There are no unwanted behaviors. She denies depression, SI, HI or hallucinations.  No longer thinks her neighbors were trying to harm her.  At times she becomes  Confused and ask for her sons as if they were here.  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 Chronic renal disease [N18.9] 03/27/2015  . 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 B12 deficiency [E53.8] 03/18/2015  . Diabetes ([place]) [E11.9] 03/14/2015  . Essential hypertension [I10] 03/14/2015  . Dyslipidemia [E78.5] 03/14/2015  . Hearing loss [H91.90] 03/14/2015  . GERD (gastroesophageal reflux disease) [K21.9] 03/14/2015  . Vitamin D deficiency [E55.9] 03/14/2015   Total Time spent with patient: 20 minutes  Past Psychiatric History: Psychosis.ast Medical History:  Past Medical History  Diagnosis Date  . Diabetes mellitus without complication ([place])   . Hypertension    History reviewed. No pertinent past surgical history. Family History: History reviewed. No pertinent family history. Family Psychiatric  History: See H&P.  Social History:  History  Alcohol Use No     History  Drug Use Not on file    Social History   Social History  . Marital Status: Single    Spouse Name: N/A   . Number of Children: N/A  . Years of Education: N/A   Social History Main Topics  . Smoking status: Never Smoker   . Smokeless tobacco: None  . Alcohol Use: No  . Drug Use: None  . Sexual Activity: Not Asked   Other Topics Concern  . None   Social History Narrative      Sleep: Good  Appetite:  Good  Current Medications: Current Facility-Administered Medications  Medication Dose Route Frequency Provider Last Rate Last Dose  . acetaminophen (TYLENOL) tablet 650 mg  650 mg Oral Q6H PRN Hildred Priest, MD   650 mg at 03/25/15 0651  . alum & mag hydroxide-simeth (MAALOX/MYLANTA) 200-200-20 MG/5ML suspension 30 mL  30 mL Oral Q4H PRN Hildred Priest, MD      . citalopram (CELEXA) tablet 10 mg  10 mg Oral Daily Chauncey Mann, MD   10 mg at 03/27/15 1055  . cyanocobalamin tablet 1,000 mcg  1,000 mcg Oral Daily Chauncey Mann, MD   1,000 mcg at 03/27/15 1059  . feeding supplement (GLUCERNA SHAKE) (GLUCERNA SHAKE) liquid 237 mL  237 mL Oral TID BM Hildred Priest, MD   237 mL at 03/27/15 1100  . fenofibrate tablet 54 mg  54 mg Oral Daily Hildred Priest, MD   54 mg at 03/27/15 1058  . fluticasone (FLONASE) 50 MCG/ACT nasal spray 2 spray  2 spray Each Nare Daily Hildred Priest, MD   2 spray at 03/27/15 1057  . gemfibrozil (LOPID) tablet 600 mg  600 mg Oral BID AC Hildred Priest, MD   600 mg at 03/27/15 1813  . [START ON 03/28/2015] glimepiride (AMARYL) tablet 2 mg  2 mg Oral Q breakfast Hildred Priest, MD      . hydrOXYzine (ATARAX/VISTARIL) tablet 50 mg  50 mg Oral QHS PRN Marjie Skiff, MD      . magnesium hydroxide (MILK OF MAGNESIA) suspension 30 mL  30 mL Oral Daily PRN Hildred Priest, MD      . pantoprazole (PROTONIX) EC tablet 40 mg  40 mg Oral Daily Hildred Priest, MD   40 mg at 03/27/15 1059  . risperiDONE (RISPERDAL) tablet 3 mg  3 mg Oral QHS Clovis Fredrickson, MD   3 mg at  03/27/15 2156  . Vitamin D (Ergocalciferol) (DRISDOL) capsule 50,000 Units  50,000 Units Oral Weekly Rainey Pines, MD   50,000 Units at 03/27/15 9628    Lab Results:  Results for orders placed or performed during the hospital encounter of 03/13/15 (from the past 48 hour(s))  Glucose, capillary     Status: Abnormal   Collection Time: 03/26/15 12:48 AM  Result Value Ref Range   Glucose-Capillary 211 (H) 65 - 99 mg/dL  Glucose, capillary     Status: Abnormal   Collection Time: 03/26/15  6:47 AM  Result Value Ref Range   Glucose-Capillary 134 (H) 65 - 99 mg/dL  Glucose, capillary     Status: Abnormal   Collection Time: 03/26/15 11:54 AM  Result Value Ref Range   Glucose-Capillary 217 (H) 65 - 99 mg/dL   Comment 1 Notify RN   Comprehensive metabolic panel     Status: Abnormal   Collection Time: 03/26/15 12:53 PM  Result Value Ref Range   Sodium 136 135 - 145 mmol/L   Potassium 4.3 3.5 - 5.1 mmol/L   Chloride 100 (L) 101 - 111 mmol/L   CO2 28 22 - 32 mmol/L   Glucose, Bld 266 (H) 65 - 99 mg/dL   BUN 33 (H) 6 - 20 mg/dL   Creatinine, Ser 1.24 (H) 0.44 - 1.00 mg/dL   Calcium 10.2 8.9 - 10.3 mg/dL   Total Protein 7.7 6.5 - 8.1 g/dL   Albumin 4.1 3.5 - 5.0 g/dL   AST 46 (H) 15 - 41 U/L   ALT 20 14 - 54 U/L   Alkaline Phosphatase 61 38 - 126 U/L   Total Bilirubin 0.5 0.3 - 1.2 mg/dL   GFR calc non Af Amer 42 (L) >60 mL/min   GFR calc Af Amer 49 (L) >60 mL/min    Comment: (NOTE) The eGFR has been calculated using the CKD EPI equation. This calculation has not been validated in all clinical situations. eGFR's persistently <60 mL/min signify possible Chronic Kidney Disease.    Anion gap 8 5 - 15  Glucose, capillary     Status: Abnormal   Collection Time: 03/26/15  4:40 PM  Result Value Ref Range   Glucose-Capillary 160 (H) 65 - 99 mg/dL   Comment 1 Notify RN   Glucose, capillary     Status: Abnormal   Collection Time: 03/26/15  8:34 PM  Result Value Ref Range   Glucose-Capillary  100 (H) 65 - 99 mg/dL  Glucose, capillary     Status: Abnormal   Collection Time: 03/27/15  6:54 AM  Result Value Ref Range   Glucose-Capillary 58 (L) 65 - 99 mg/dL  CBC     Status: Abnormal   Collection Time: 03/27/15  7:07 AM  Result Value Ref Range   WBC 3.9 3.6 - 11.0 K/uL   RBC 3.35 (L) 3.80 - 5.20 MIL/uL   Hemoglobin 10.7 (L)  12.0 - 16.0 g/dL   HCT 30.1 (L) 35.0 - 47.0 %   MCV 89.8 80.0 - 100.0 fL   MCH 31.8 26.0 - 34.0 pg   MCHC 35.4 32.0 - 36.0 g/dL   RDW 13.6 11.5 - 14.5 %   Platelets 161 150 - 440 K/uL  Glucose, capillary     Status: None   Collection Time: 03/27/15  8:26 AM  Result Value Ref Range   Glucose-Capillary 90 65 - 99 mg/dL   Comment 1 Document in Chart   Glucose, capillary     Status: Abnormal   Collection Time: 03/27/15 12:50 PM  Result Value Ref Range   Glucose-Capillary 252 (H) 65 - 99 mg/dL  Glucose, capillary     Status: Abnormal   Collection Time: 03/27/15  4:57 PM  Result Value Ref Range   Glucose-Capillary 123 (H) 65 - 99 mg/dL   Comment 1 Notify RN     Physical Findings: AIMS:  , ,  ,  ,    CIWA:    COWS:     Musculoskeletal: Strength & Muscle Tone: within normal limits Gait & Station: normal Patient leans: N/A  Psychiatric Specialty Exam: Review of Systems  Constitutional: Negative.   HENT: Negative.   Eyes: Negative.   Respiratory: Negative.   Cardiovascular: Negative.   Gastrointestinal: Negative.   Genitourinary: Negative.   Musculoskeletal: Negative.   Skin: Negative.   Neurological: Negative.   Endo/Heme/Allergies: Negative.   Psychiatric/Behavioral: Negative.     Blood pressure 103/65, pulse 88, temperature 97.8 F (36.6 C), temperature source Oral, resp. rate 20, height 5' 4"  (1.626 m), weight 54.885 kg (121 lb), SpO2 100 %.Body mass index is 20.76 kg/(m^2).  General Appearance: Casual  Eye Contact::  Good  Speech:  Clear and Coherent  Volume:  Normal  Mood:  Anxious  Affect:  Appropriate  Thought Process:  Goal  Directed  Orientation:  Full (Time, Place, and Person)  Thought Content:  Delusions and Paranoid Ideation  Suicidal Thoughts:  No  Homicidal Thoughts:  No  Memory:  Immediate;   Fair Recent;   Fair Remote;   Fair  Judgement:  Impaired  Insight:  Shallow  Psychomotor Activity:  Normal  Concentration:  Fair  Recall:  [place]  Language: Fair  Akathisia:  No  Handed:  Right  AIMS (if indicated):     Assets:  Communication Skills Desire for Improvement Financial Resources/Insurance Housing Physical Health Resilience Social Support  ADL's:  Intact  Cognition: WNL  Sleep:  Number of Hours: 6.25   Treatment Plan Summary: Daily contact with patient to assess and evaluate symptoms and progress in treatment and Medication management   Laurie Morton is a 75 year old Caucasian female with no prior psychiatry history who presented to our emergency department with persecutory delusions and hallucinations.  1. Psychosis unspecified: All basic blood work including CBC, compressive metabolic panel, UA, TSH and urine toxicology screen were within normal limits. Patient was not intoxicated as her alcohol level was below the detection limit. Patient has been is started on Risperdal. No longer delusional no longer paranoid. Tolerating well Risperdal.  Continue risperdal 3 mg qhs  2. B12 deficiency. B-12 level was < 300 and will give B-12 injection as it may be contributing to problems with confusion and memory. She received 3 days of B12 injections and now on  1059mg po daily  3. History of depression and anxiety: Tcontinue celexa 10 mg q day  4. Dyslipidemia:  Patient is treated with Lopid 600 mg by mouth twice a day and fenofibrate 54 mg a day.  5. GERD: We continued Protonix 40 mg a day  6. Vitamin D deficiency: Patient will be continued on vitamin D 50,000 units  7. Poor oral intake: She gas glucerna tid and will push fluids orally.  Much improved  8. Diabetes:  Patient had one episode of hypoglycemia this am. Amaryl will be decrease to 2 mg q day. Capillary blood glucose will be checked 4 times a day today.    9. Diarrhea. Resolved.   10. Metabolic syndrome. Cholesterol and TG are elevated, HbA1c 7.4.  11. Labs: ammonia level, RPR, HIV, all non reactive /wnl.  12. Imaging: Brain MRI, IMPRESSION: 1. No acute infarct. 2. Mild chronic small vessel ischemic disease. 3. 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 4. 1.1 cm subacute hemorrhage in the inferior right frontal lobe. Underlying cavernoma not excluded.  NEUROLOGY CONSULT IS GREATLY APPRECIATED.  13. Disposition: she was scheduled for d/c home today but her family never came to pick her up. Plan to d/c back home tomorrow  . I attempted to call her sons w/o success earlier today. She had a f/u with PCP tomorrow am  for hospital discharge  Hildred Priest 03/27/2015, 10:31 PM

## 2015-03-27 NOTE — [facility] Group Notes (Signed)
[facility] Group Notes:  (Nursing/MHT/Case Management/Adjunct)  Date:  03/27/2015  Time:  2:17 PM  Type of Therapy:  Psychoeducational Skills  Participation Level:  Active  Participation Quality:  Appropriate, Attentive and Sharing  Affect:  Appropriate  Cognitive:  Alert and Appropriate  Insight:  Appropriate and Good  Engagement in Group:  Engaged  Modes of Intervention:  Discussion, Education and Support  Summary of Progress/Problems:  Lynelle SmokeCara Travis Jennesis Ramaswamy 03/27/2015, 2:17 PM
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
## 2015-03-27 NOTE — [facility] Suicide Risk Assessment (Addendum)
[facility]   Demographic Factors:  Age 75 or older and Caucasian  Total Time spent with patient: 30 minutes    Psychiatric Specialty Exam: Physical Exam  ROS                                                         Have you used any form of tobacco in the last 30 days? (Cigarettes, Smokeless Tobacco, Cigars, and/or Pipes): No  Has this patient used any form of tobacco in the last 30 days? (Cigarettes, Smokeless Tobacco, Cigars, and/or Pipes) No  Mental Status Per Nursing Assessment::   On Admission:     Current Mental Status by Physician: no longer having delusions, hallucinations, or paranoia.  Calm, pleasant and cooperative.  Denies depression or anxiety. Denies SI  Loss Factors: Decline in physical health  Historical Factors: NA  Risk Reduction Factors:   Sense of responsibility to family and Living with another person, especially a relative  Continued Clinical Symptoms:  Medical Diagnoses and Treatments/Surgeries  Cognitive Features That Contribute To Risk:  Loss of executive function    Suicide Risk:  Minimal: No identifiable suicidal ideation.  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 Chronic renal disease [N18.9] 03/27/2015  . 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 B12 deficiency [E53.8] 03/18/2015  . Diabetes ([facility]) [E11.9]  03/14/2015  . Essential hypertension [I10] 03/14/2015  . Dyslipidemia [E78.5] 03/14/2015  . Hearing loss [H91.90] 03/14/2015  . GERD (gastroesophageal reflux disease) [K21.9] 03/14/2015  . Vitamin D deficiency [E55.9] 03/14/2015    Follow-up Information    [facility]. Schedule an appointment as soon as possible for a visit in 3 days.   Specialty:  Family Medicine   Contact information:   [address] [place] [address] [phone]       [facility]. Go in 3 days.   Why:  Hospital Follow up, Outpatient Medication Management, Therapy, Please take your photo ID and Insurance card, Walk-ins Monday-Friday between 9am-4pm.   Contact information:   [address] [place] [address] Phone: [phone] Fax:[phone]       Is patient on multiple antipsychotic therapies at discharge:  No   Has Patient had three or more failed trials of antipsychotic monotherapy by history:  No  Recommended Plan for Multiple Antipsychotic Therapies: NA    Jimmy FootmanHernandez-Gonzalez,  Lamarcus Spira 03/28/2015, 8:00 AM
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
## 2015-03-27 NOTE — Progress Notes (Addendum)
D: Pt discharge discontinued this shift d/t lack of transportation. Pt contacted son who told her that he would not pick her up this evening. Pt states "he told me he has to work late and the doctor told him I couldn't be left alone." Pt becomes increasingly anxious d/t extended length of stay. Pt states that her son has the keys to her apartment so she is not able to enter unless she gets a spare pair of keys from the front office in the morning. Pt denies SI/HI/AVH at this time. Denies pain. A: Emotional support and encouragement provided. Attempted to contact pt's sons and left a voicemail. Pt informed of plan to continue with the d/c in the morning. Medications given as prescribed with education. q15 minute safety checks maintained. R: Pt verbalizes understanding. Pt remains free from harm.

## 2015-03-28 LAB — GLUCOSE, CAPILLARY
Glucose-Capillary: 121 mg/dL — ABNORMAL HIGH (ref 65–99)
Glucose-Capillary: 188 mg/dL — ABNORMAL HIGH (ref 65–99)
Glucose-Capillary: 262 mg/dL — ABNORMAL HIGH (ref 65–99)

## 2015-03-28 NOTE — Tx Team (Deleted)
  Interdisciplinary Treatment Plan Update (Adult)  Date: 03/22/2015 Time Reviewed: 5:11 PM  Progress in Treatment: Attending groups: No. Participating in groups: No. Taking medication as prescribed: Yes. Tolerating medication: Yes. Family/Significant othe contact made: Yes, individual(s) contacted: Son Patient understands diagnosis: No. and As evidenced by: Limited insight  Discussing patient identified problems/goals with staff: Yes. Medical problems stabilized or resolved: Yes. Denies suicidal/homicidal ideation: Yes. Issues/concerns per patient self-inventory: Yes. Other:  New problem(s) identified: No, Describe: NA  Discharge Plan or Barriers: Pt plans to return home and follow up with outpatient.   Reason for Continuation of Hospitalization: Delusions  Medication stabilization  Comments: Ms. Laurie Morton continues to be pleasantly confused. She continuously worries about her sons who she believes will be picking her up from the hospital right now. She is less paranoid and agreed to MRI today. Unfortunately MRI indicates that there are unemployed the positions in the brain vessels. This is not treatable condition. We requested neurology consult. There are no somatic complaints. She tolerates medications well. She eagerly participates in programming in spite of confusion  Estimated length of stay: 7 days   New goal(s):  Review of initial/current patient goals per problem list:  1. Goal(s): Patient will participate in aftercare plan  Met:  Target date: at discharge  As evidenced by: Patient will participate within aftercare plan AEB aftercare provider and housing plan at discharge being identified.  2. Goal (s): Patient will demonstrate decreased symptoms of psychosis.  Met:No  Target date: at discharge  As evidenced by: Patient will not endorse signs of psychosis or be deemed stable for discharge by MD.  Attendees: Patient: Laurie Morton  12/29/20165:11 PM  Family:  12/29/20165:11 PM  Physician: Dr. Bary Leriche  12/29/20165:11 PM  Nursing: Anderson Malta, RN  12/29/20165:11 PM  Case Manager:  12/29/20165:11 PM  Counselor:  12/29/20165:11 PM  Other: August Saucer, MSW, LCSW  12/29/20165:11 PM  Other:  12/29/20165:11 PM  Other:  12/29/20165:11 PM  Other:  12/29/20165:11 PM  Other:  12/29/20165:11 PM  Other:  12/29/20165:11 PM  Other:  12/29/20165:11 PM  Other:  12/29/20165:11 PM  Other:  12/29/20165:11 PM  Other:  12/29/20165:11 PM   Scribe for Treatment Team:  August Saucer, MSW, LCSW, 03/26/2014

## 2015-03-28 NOTE — Progress Notes (Signed)
Recreation Therapy Notes  Date: 01.04.17 Time: 3:00 pm Location: Craft Room  Group Topic: Self-esteem  Goal Area(s) Addresses:  Patient will write at least one positive trait. Patient will verbalize benefit of having a healthy self-esteem.  Behavioral Response: Did not attend  Intervention: I Am  Activity: Patients were given a worksheet with the letter I on it and instructed to write as many positive traits inside the letter as they could.  Education: LRT educated patients on ways they can increase their self-esteem.  Education Outcome: Patient did not attend group.  Clinical Observations/Feedback: Patient did not attend group.  Slaton Reaser M, LRT/CTRS 03/28/2015 4:06 PM 

## 2015-03-28 NOTE — [facility] Group Notes (Signed)
[facility] LCSW Aftercare Discharge Planning Group Note  03/28/2015 9:15 AM  Participation Quality: Did Not Attend. Patient invited to participate but declined.   Laurie Vold F. Andree Golphin, MSW, LCSWA, LCAS   

## 2015-03-28 NOTE — Progress Notes (Signed)
  [facility] :  Will you be returning to the same living situation after discharge:  No. Home with son At discharge, do you have transportation home?: Yes,  patient's son to pick up Do you have the ability to pay for your medications: Yes,  patient has Medicare  Release of information consent forms completed and in the chart;  Patient's signature needed at discharge.  Patient to Follow up at: Follow-up Information    [facility] Schedule an appointment as soon as possible for a visit in 3 days.   Specialty:  Family Medicine   Contact information:   [address] [place] [place] [address] [phone]       [facility] Go on 04/12/2015.   Why:  For follow-up care appt with Theresa MulliganKathleen Sisk, NP on Thursday 02/10/16 at 11:00am   Contact information:   [address] [place], [place] [phone] Fax [phone]       [facility]   Why:  For follow-up care home health will meet patient next week for assessment of needs within 7 days of hospital discharge.    Contact information:   [address]  [place], [place] [phone]      Next level of care provider has access to [facility] Link:no  Safety Planning and Suicide Prevention discussed: Yes,  SPE completed with patient and Luretha RuedBryan Hockenberry (son) [phone]   Have you used any form of tobacco in the last 30 days? (Cigarettes, Smokeless Tobacco, Cigars, and/or Pipes): No  Has patient been referred to the Quitline?: N/A patient is not a smoker  Patient has been referred for addiction treatment: N/A  Lulu RidingIngle, Lilas Diefendorf T, MSW, LCSWA 03/28/2015, 2:37 PM

## 2015-03-28 NOTE — [facility] Suicide Risk Assessment (Signed)
[facility] INPATIENT:  Family/Significant Other Suicide Prevention Education  Suicide Prevention Education:  Education Completed; Laurie Morton (son) [phone] has been identified by the patient as the family member/significant other with whom the patient will be residing, and identified as the person(s) who will aid the patient in the event of a mental health crisis (suicidal ideations/suicide attempt).  With written consent from the patient, the family member/significant other has been provided the following suicide prevention education, prior to the and/or following the discharge of the patient.  The suicide prevention education provided includes the following:  Suicide risk factors  Suicide prevention and interventions  National Suicide Hotline telephone number  [facility] assessment telephone number  [facility] Emergency Assistance 911  [facility] and/or Residential Mobile Crisis Unit telephone number  Request made of family/significant other to:  Remove weapons (e.g., guns, rifles, knives), all items previously/currently identified as safety concern.    Remove drugs/medications (over-the-counter, prescriptions, illicit drugs), all items previously/currently identified as a safety concern.  The family member/significant other verbalizes understanding of the suicide prevention education information provided.  The family member/significant other agrees to remove the items of safety concern listed above.  Lulu RidingIngle, Kamaree Berkel T, MSW, LCSWA  03/28/2015, 11:04 AM

## 2015-03-28 NOTE — Tx Team (Signed)
  Interdisciplinary Treatment Plan Update (Adult)  Date: 03/27/2015 Time Reviewed: 4:15pm  Progress in Treatment: Attending groups: No. Participating in groups: No. Taking medication as prescribed: Yes. Tolerating medication: Yes. Family/Significant othe contact made: Yes, individual(s) contacted: Son [name] Patient understands diagnosis: No. and As evidenced by: Limited insight  Discussing patient identified problems/goals with staff: Yes. Medical problems stabilized or resolved: Yes. Denies suicidal/homicidal ideation: Yes. Issues/concerns per patient self-inventory: Yes. Other:  New problem(s) identified: No, Describe: NA  Discharge Plan or Barriers: Pt plans to go home with sons and follow up with outpatient provider.   Reason for Continuation of Hospitalization: Delusions  Medication stabilization  Comments: Ms. Vessell continues to be pleasantly confused.She is unfortunately likely at a new baseline and will be discharged home today or tomorrow.  Estimated length of stay: 0-1 days   New goal(s):  Review of initial/current patient goals per problem list:  1. Goal(s): Patient will participate in aftercare plan  Met:  Target date: at discharge  As evidenced by: Patient will participate within aftercare plan AEB aftercare provider and housing plan at discharge being identified.  2. Goal (s): Patient will demonstrate decreased symptoms of psychosis.  Met:No  Target date: at discharge  As evidenced by: Patient will not endorse signs of psychosis or be deemed stable for discharge by MD.  Attendees: Patient: Laurie Morton 12/29/20165:11 PM  Family:  12/29/20165:11 PM  Physician: Dr. Bary Leriche  12/29/20165:11 PM  Nursing: Anderson Malta, RN  12/29/20165:11 PM  Case Manager:  12/29/20165:11 PM  Counselor:  12/29/20165:11 PM  Other: Dossie Arbour, LCSW 12/29/20165:11 PM  Other:  12/29/20165:11 PM  Other:  12/29/20165:11 PM   Other:  12/29/20165:11 PM  Other:  12/29/20165:11 PM  Other:  12/29/20165:11 PM  Other:  12/29/20165:11 PM  Other:  12/29/20165:11 PM  Other:  12/29/20165:11 PM  Other:  12/29/20165:11 PM   Scribe for Treatment Team:  Carloyn Jaeger. Karmon Andis, MSW, LCSW, 03/27/2015

## 2015-03-28 NOTE — [facility] Group Notes (Signed)
[facility] LCSW Group Therapy   03/28/2015 1pm  Type of Therapy: Group Therapy   Participation Level: Did Not Attend. Patient invited to participate but declined.    Lenni Reckner F. Piercen Covino, MSW, LCSWA, LCAS   

## 2015-03-28 NOTE — Progress Notes (Signed)
Patient discharged home. DC instructions provided and explained. Medications reviewed. Rx given. All questions answered. Pt stable at DC. Denies SI, HI,AVH. Belongings returned. Stored medications returned.

## 2016-06-16 ENCOUNTER — Emergency Department: Payer: Medicare HMO

## 2016-06-16 ENCOUNTER — Inpatient Hospital Stay
Admission: EM | Admit: 2016-06-16 | Discharge: 2016-06-22 | DRG: 637 | Disposition: E | Payer: Medicare HMO | Attending: Pulmonary Disease | Admitting: Pulmonary Disease

## 2016-06-16 DIAGNOSIS — Z978 Presence of other specified devices: Secondary | ICD-10-CM

## 2016-06-16 DIAGNOSIS — I10 Essential (primary) hypertension: Secondary | ICD-10-CM | POA: Diagnosis present

## 2016-06-16 DIAGNOSIS — I638 Other cerebral infarction: Secondary | ICD-10-CM | POA: Diagnosis not present

## 2016-06-16 DIAGNOSIS — E785 Hyperlipidemia, unspecified: Secondary | ICD-10-CM | POA: Diagnosis present

## 2016-06-16 DIAGNOSIS — E870 Hyperosmolality and hypernatremia: Secondary | ICD-10-CM | POA: Diagnosis not present

## 2016-06-16 DIAGNOSIS — K219 Gastro-esophageal reflux disease without esophagitis: Secondary | ICD-10-CM | POA: Diagnosis present

## 2016-06-16 DIAGNOSIS — F0390 Unspecified dementia without behavioral disturbance: Secondary | ICD-10-CM | POA: Diagnosis present

## 2016-06-16 DIAGNOSIS — Z452 Encounter for adjustment and management of vascular access device: Secondary | ICD-10-CM

## 2016-06-16 DIAGNOSIS — R40243 Glasgow coma scale score 3-8, unspecified time: Secondary | ICD-10-CM

## 2016-06-16 DIAGNOSIS — E1101 Type 2 diabetes mellitus with hyperosmolarity with coma: Secondary | ICD-10-CM | POA: Diagnosis not present

## 2016-06-16 DIAGNOSIS — E1100 Type 2 diabetes mellitus with hyperosmolarity without nonketotic hyperglycemic-hyperosmolar coma (NKHHC): Secondary | ICD-10-CM

## 2016-06-16 DIAGNOSIS — Z4659 Encounter for fitting and adjustment of other gastrointestinal appliance and device: Secondary | ICD-10-CM

## 2016-06-16 DIAGNOSIS — J9690 Respiratory failure, unspecified, unspecified whether with hypoxia or hypercapnia: Secondary | ICD-10-CM

## 2016-06-16 DIAGNOSIS — Z9119 Patient's noncompliance with other medical treatment and regimen: Secondary | ICD-10-CM

## 2016-06-16 DIAGNOSIS — G9340 Encephalopathy, unspecified: Secondary | ICD-10-CM

## 2016-06-16 DIAGNOSIS — E1110 Type 2 diabetes mellitus with ketoacidosis without coma: Secondary | ICD-10-CM | POA: Diagnosis not present

## 2016-06-16 DIAGNOSIS — F209 Schizophrenia, unspecified: Secondary | ICD-10-CM | POA: Diagnosis present

## 2016-06-16 DIAGNOSIS — F329 Major depressive disorder, single episode, unspecified: Secondary | ICD-10-CM | POA: Diagnosis present

## 2016-06-16 DIAGNOSIS — Z7984 Long term (current) use of oral hypoglycemic drugs: Secondary | ICD-10-CM

## 2016-06-16 DIAGNOSIS — N179 Acute kidney failure, unspecified: Secondary | ICD-10-CM | POA: Diagnosis present

## 2016-06-16 DIAGNOSIS — D649 Anemia, unspecified: Secondary | ICD-10-CM | POA: Diagnosis not present

## 2016-06-16 DIAGNOSIS — Z515 Encounter for palliative care: Secondary | ICD-10-CM | POA: Diagnosis not present

## 2016-06-16 DIAGNOSIS — Z8249 Family history of ischemic heart disease and other diseases of the circulatory system: Secondary | ICD-10-CM

## 2016-06-16 DIAGNOSIS — I639 Cerebral infarction, unspecified: Secondary | ICD-10-CM

## 2016-06-16 DIAGNOSIS — I959 Hypotension, unspecified: Secondary | ICD-10-CM | POA: Diagnosis not present

## 2016-06-16 DIAGNOSIS — R4189 Other symptoms and signs involving cognitive functions and awareness: Secondary | ICD-10-CM

## 2016-06-16 DIAGNOSIS — Z66 Do not resuscitate: Secondary | ICD-10-CM | POA: Diagnosis not present

## 2016-06-16 DIAGNOSIS — G936 Cerebral edema: Secondary | ICD-10-CM | POA: Diagnosis not present

## 2016-06-16 DIAGNOSIS — R402434 Glasgow coma scale score 3-8, 24 hours or more after hospital admission: Secondary | ICD-10-CM | POA: Diagnosis not present

## 2016-06-16 DIAGNOSIS — R4020 Unspecified coma: Secondary | ICD-10-CM

## 2016-06-16 DIAGNOSIS — Z79899 Other long term (current) drug therapy: Secondary | ICD-10-CM

## 2016-06-16 DIAGNOSIS — E876 Hypokalemia: Secondary | ICD-10-CM | POA: Diagnosis not present

## 2016-06-16 DIAGNOSIS — J9600 Acute respiratory failure, unspecified whether with hypoxia or hypercapnia: Secondary | ICD-10-CM | POA: Diagnosis not present

## 2016-06-16 DIAGNOSIS — G9349 Other encephalopathy: Secondary | ICD-10-CM | POA: Diagnosis not present

## 2016-06-16 HISTORY — DX: Schizophrenia, unspecified: F20.9

## 2016-06-16 HISTORY — DX: Unspecified dementia, unspecified severity, without behavioral disturbance, psychotic disturbance, mood disturbance, and anxiety: F03.90

## 2016-06-16 LAB — URINALYSIS, COMPLETE (UACMP) WITH MICROSCOPIC
Bilirubin Urine: NEGATIVE
Glucose, UA: >=500 mg/dL — AB
Hgb urine dipstick: NEGATIVE
Ketones, ur: 5 mg/dL — AB
Leukocytes, UA: NEGATIVE
Nitrite: NEGATIVE
Protein, ur: 100 mg/dL — AB
RBC / HPF: NONE SEEN RBC/hpf (ref 0–5)
Specific Gravity, Urine: 1.025 (ref 1.005–1.030)
pH: 5 (ref 5.0–8.0)

## 2016-06-16 LAB — BASIC METABOLIC PANEL
Anion gap: 17 — ABNORMAL HIGH (ref 5–15)
BUN: 99 mg/dL — AB (ref 6–20)
CALCIUM: 10.9 mg/dL — AB (ref 8.9–10.3)
CO2: 28 mmol/L (ref 22–32)
CREATININE: 1.93 mg/dL — AB (ref 0.44–1.00)
Chloride: 91 mmol/L — ABNORMAL LOW (ref 101–111)
GFR calc Af Amer: 29 mL/min — ABNORMAL LOW (ref >60)
GFR calc non Af Amer: 25 mL/min — ABNORMAL LOW (ref >60)
GLUCOSE: 1024 mg/dL — AB (ref 65–99)
Potassium: 4.3 mmol/L (ref 3.5–5.1)
Sodium: 136 mmol/L (ref 135–145)

## 2016-06-16 LAB — CBC
HCT: 45.1 % (ref 35.0–47.0)
Hemoglobin: 14.8 g/dL (ref 12.0–16.0)
MCH: 30.5 pg (ref 26.0–34.0)
MCHC: 32.7 g/dL (ref 32.0–36.0)
MCV: 93.2 fL (ref 80.0–100.0)
Platelets: 213 K/uL (ref 150–440)
RBC: 4.84 MIL/uL (ref 3.80–5.20)
RDW: 13.9 % (ref 11.5–14.5)
WBC: 15.8 K/uL — ABNORMAL HIGH (ref 3.6–11.0)

## 2016-06-16 LAB — GLUCOSE, CAPILLARY

## 2016-06-16 MED ORDER — INSULIN ASPART 100 UNIT/ML ~~LOC~~ SOLN
5.0000 [IU] | Freq: Once | SUBCUTANEOUS | Status: AC
  Administered 2016-06-16: 5 [IU] via INTRAVENOUS
  Filled 2016-06-16: qty 5

## 2016-06-16 MED ORDER — SODIUM CHLORIDE 0.9 % IV SOLN
Freq: Once | INTRAVENOUS | Status: AC
  Administered 2016-06-16: 23:00:00 via INTRAVENOUS

## 2016-06-16 MED ORDER — SODIUM CHLORIDE 0.9 % IV SOLN
INTRAVENOUS | Status: DC
  Administered 2016-06-17: 5.4 [IU]/h via INTRAVENOUS
  Filled 2016-06-16: qty 2.5

## 2016-06-16 NOTE — ED Triage Notes (Signed)
Pt arrived via ems for c/o hyperglycemia -- pt has dementia and is oriented to person and DOB but not to p[lace and time - pt lives at home with family and they reported that they returned home tonight and found pt in the bathroom floor - ems and family report that nothing was misplaced and it appears that pt just crawled in the floor to the bathroom

## 2016-06-16 NOTE — ED Provider Notes (Signed)
[facility] Emergency Department Provider Note   ____________________________________________   First MD Initiated Contact with Patient 04-16-2016 2307     (approximate)  I have reviewed the triage vital signs and the nursing notes.   HISTORY  Chief Complaint Hyperglycemia  History limited by patient's altered mental status  HPI Laurie Morton is a 76 y.o. female patient brought in from home by EMS. Family reported they came back home tonight and found the patient on the bathroom floor. Family thinks that she called there. Nothing was displaced. Dementia. Patient is currently not answering questions. She is somewhat groggy. Glucose came back at 1024.   Past Medical History:  Diagnosis Date  . Dementia   . Diabetes mellitus without complication (HCC)   . Hypertension   . Schizophrenia [facility](HCC)     Patient Active Problem List   Diagnosis Date Noted  . DKA (diabetic ketoacidoses) (HCC) 06/17/2016  . Dementia 06/17/2016  . Chronic renal disease 03/27/2015  . 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 Cerebrovascular disease (mild chronic small vessel) 03/27/2015  . Psychosis in elderly with behavioral disturbance   . B12 deficiency 03/18/2015  . Diabetes (HCC) 03/14/2015  . Essential hypertension 03/14/2015  . Dyslipidemia 03/14/2015  . Hearing loss 03/14/2015  . GERD (gastroesophageal reflux disease) 03/14/2015  . Vitamin D deficiency 03/14/2015    Past Surgical History:  Procedure Laterality Date  . ABDOMINAL HYSTERECTOMY    . TONSILLECTOMY      Prior to Admission medications   Medication Sig Start Date End Date Taking? Authorizing Provider  albuterol (PROVENTIL HFA;VENTOLIN HFA) 108 (90 BASE) MCG/ACT inhaler Inhale 2 puffs into the lungs every 6 (six) hours as needed for wheezing or shortness of breath.   Yes  Historical Provider, MD  atorvastatin (LIPITOR) 20 MG tablet Take 20 mg by mouth daily.   Yes Historical Provider, MD  ergocalciferol (VITAMIN D2) 50000 UNITS capsule Take 50,000 Units by mouth once a week.   Yes Historical Provider, MD  escitalopram (LEXAPRO) 10 MG tablet Take 10 mg by mouth daily.   Yes Historical Provider, MD  fenofibrate (TRICOR) 48 MG tablet Take 48 mg by mouth daily.   Yes Historical Provider, MD  gemfibrozil (LOPID) 600 MG tablet Take 600 mg by mouth daily.    Yes Historical Provider, MD  lisinopril (PRINIVIL,ZESTRIL) 20 MG tablet Take 20 mg by mouth daily.   Yes Historical Provider, MD  magnesium oxide (MAG-OX) 400 MG tablet Take 400 mg by mouth daily.   Yes Historical Provider, MD  metFORMIN (GLUCOPHAGE) 500 MG tablet Take 1,000 mg by mouth 2 (two) times daily with a meal.   Yes Historical Provider, MD  risperiDONE (RISPERDAL) 1 MG tablet Take 1 mg by mouth at bedtime.   Yes Historical Provider, MD  citalopram (CELEXA) 10 MG tablet Take 1 tablet (10 mg total) by mouth daily. Patient not taking: Reported on 10/23/16 03/27/15   Jimmy FootmanAndrea Hernandez-Gonzalez, MD  cyanocobalamin 1000 MCG tablet Take 1 tablet (1,000 mcg total) by mouth daily. Patient not taking: Reported on 10/23/16 03/27/15   Jimmy FootmanAndrea Hernandez-Gonzalez, MD  glimepiride (AMARYL) 2 MG tablet Take 1 tablet (2 mg total) by mouth daily with breakfast. Patient not taking: Reported on 10/23/16 03/28/15   Jimmy FootmanAndrea Hernandez-Gonzalez, MD    Allergies Ambien [zolpidem tartrate]  Family History  Problem Relation Age of Onset  . CAD Father   . Hypertension Father   .  CAD Brother     Social History Social History  Substance Use Topics  . Smoking status: Never Smoker  . Smokeless tobacco: Never Used  . Alcohol use No    Review of Systems  Unable to obtain ____________________________________________   PHYSICAL EXAM:  VITAL SIGNS: ED Triage Vitals [06/02/2016 2151]  Enc Vitals Group     BP (!) 150/77      Pulse Rate 97     Resp 16     Temp 97.7 F (36.5 C)     Temp Source Oral     SpO2 100 %     Weight 125 lb (56.7 kg)     Height 5\' 6"  (1.676 m)     Head Circumference      Peak Flow      Pain Score 0     Pain Loc      Pain Edu?      Excl. in GC?     Constitutional: AP but arousable in no acute distress. Eyes: Conjunctivae are normal. PERRL. EOMI. Head: Atraumatic. Nose: No congestion/rhinnorhea. Mouth/Throat: Mucous membranes are moist.  Oropharynx non-erythematous. Neck: No stridor. No cervical spine tenderness to palpation. Cardiovascular: Normal rate, regular rhythm. Grossly normal heart sounds.  Good peripheral circulation. Respiratory: Normal respiratory effort.  No retractions. Lungs CTAB. Gastrointestinal: Soft and nontender. No distention. No abdominal bruits. No CVA tenderness. Musculoskeletal: No lower extremity tenderness nor edema.  No joint effusions.   ____________________________________________   LABS (all labs ordered are listed, but only abnormal results are displayed)  Labs Reviewed  BASIC METABOLIC PANEL - Abnormal; Notable for the following:       Result Value   Chloride 91 (*)    Glucose, Bld 1,024 (*)    BUN 99 (*)    Creatinine, Ser 1.93 (*)    Calcium 10.9 (*)    GFR calc non Af Amer 25 (*)    GFR calc Af Amer 29 (*)    Anion gap 17 (*)    All other components within normal limits  CBC - Abnormal; Notable for the following:    WBC 15.8 (*)    All other components within normal limits  URINALYSIS, COMPLETE (UACMP) WITH MICROSCOPIC - Abnormal; Notable for the following:    Color, Urine YELLOW (*)    APPearance HAZY (*)    Glucose, UA >=500 (*)    Ketones, ur 5 (*)    Protein, ur 100 (*)    Bacteria, UA RARE (*)    Squamous Epithelial / LPF 0-5 (*)    All other components within normal limits  GLUCOSE, CAPILLARY - Abnormal; Notable for the following:    Glucose-Capillary >600 (*)    All other components within normal limits  HEPATIC  FUNCTION PANEL - Abnormal; Notable for the following:    Total Protein 8.2 (*)    ALT 13 (*)    Bilirubin, Direct <0.1 (*)    All other components within normal limits  TROPONIN I - Abnormal; Notable for the following:    Troponin I 0.04 (*)    All other components within normal limits  URINE CULTURE  BLOOD GAS, ARTERIAL  CBG MONITORING, ED   ____________________________________________  EKG  ____________________________________________  RADIOLOGY  Study Result   CLINICAL DATA:  Possible syncope. Dementia patient, found on bathroom floor.  EXAM: CT HEAD WITHOUT CONTRAST  TECHNIQUE: Contiguous axial images were obtained from the base of the skull through the vertex without intravenous contrast.  COMPARISON:  No prior  head CT.  Brain MRI 03/22/2015  FINDINGS: Brain: No acute hemorrhage. 11 mm hyperdensity in the inferior right frontal lobe is likely a cavernoma and corresponds to that seen on prior MRI, unchanged in size. No evidence of surrounding edema. No CT findings of acute ischemia. No mass effect or midline shift. No hydrocephalus. Mild atrophy is stable. Periventricular white matter changes, nonspecific but likely chronic small vessel ischemia, stable. No extra-axial fluid collection.  Vascular: Atherosclerosis of skullbase vasculature without hyperdense vessel or abnormal calcification.  Skull: Normal. Negative for fracture or focal lesion.  Sinuses/Orbits: Paranasal sinuses and mastoid air cells are clear. The visualized orbits are unremarkable.  Other: None.  IMPRESSION: 1. No acute abnormality. 2. Mild chronic small vessel ischemia. 3. 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   Electronically Signed   By: Rubye Oaks M.D.   On: 06/17/2016 00:23   Study Result   CLINICAL DATA:  Possible syncope. Dementia patient with hyperglycemia, found on bathroom floor.  EXAM: PORTABLE CHEST  1 VIEW  COMPARISON:  Frontal and lateral views 11/23/2012  FINDINGS: The cardiomediastinal contours are normal. Calcified granuloma and mediastinal nodes. Pulmonary vasculature is normal. No consolidation, pleural effusion, or pneumothorax. No acute osseous abnormalities are seen. The bones appear under mineralized.  IMPRESSION: No acute abnormality.   Electronically Signed   By: Rubye Oaks M.D.    ____________________________________________   PROCEDURES  Procedure(s) performed:  Procedures  Critical Care performed:   ____________________________________________   INITIAL IMPRESSION / ASSESSMENT AND PLAN / ED COURSE  Pertinent labs & imaging results that were available during my care of the patient were reviewed by me and considered in my medical decision making (see chart for details).        ____________________________________________   FINAL CLINICAL IMPRESSION(S) / ED DIAGNOSES  Final diagnoses:  Hyperosmolar non-ketotic state in patient with type 2 diabetes mellitus (HCC)      NEW MEDICATIONS STARTED DURING THIS VISIT:  New Prescriptions   No medications on file     Note:  This document was prepared using Dragon voice recognition software and may include unintentional dictation errors.    Arnaldo Natal, MD 06/17/16 [phone]

## 2016-06-16 NOTE — ED Notes (Signed)
Pt arrived via ems for c/o hyperglycemia -- pt has dementia and is oriented to person and DOB but not to p[lace and time - pt lives at home with family and they reported that they returned home tonight and found pt in the bathroom floor - ems and family report that nothing was misplaced and it appears that pt just crawled in the floor to the bathroom 

## 2016-06-17 ENCOUNTER — Encounter: Payer: Self-pay | Admitting: Internal Medicine

## 2016-06-17 DIAGNOSIS — I639 Cerebral infarction, unspecified: Secondary | ICD-10-CM | POA: Diagnosis not present

## 2016-06-17 DIAGNOSIS — K219 Gastro-esophageal reflux disease without esophagitis: Secondary | ICD-10-CM | POA: Diagnosis present

## 2016-06-17 DIAGNOSIS — F0390 Unspecified dementia without behavioral disturbance: Secondary | ICD-10-CM | POA: Diagnosis present

## 2016-06-17 DIAGNOSIS — R4189 Other symptoms and signs involving cognitive functions and awareness: Secondary | ICD-10-CM | POA: Diagnosis not present

## 2016-06-17 DIAGNOSIS — E870 Hyperosmolality and hypernatremia: Secondary | ICD-10-CM | POA: Diagnosis not present

## 2016-06-17 DIAGNOSIS — J9600 Acute respiratory failure, unspecified whether with hypoxia or hypercapnia: Secondary | ICD-10-CM | POA: Diagnosis not present

## 2016-06-17 DIAGNOSIS — Z8249 Family history of ischemic heart disease and other diseases of the circulatory system: Secondary | ICD-10-CM | POA: Diagnosis not present

## 2016-06-17 DIAGNOSIS — F329 Major depressive disorder, single episode, unspecified: Secondary | ICD-10-CM | POA: Diagnosis present

## 2016-06-17 DIAGNOSIS — G9349 Other encephalopathy: Secondary | ICD-10-CM | POA: Diagnosis not present

## 2016-06-17 DIAGNOSIS — I10 Essential (primary) hypertension: Secondary | ICD-10-CM | POA: Diagnosis present

## 2016-06-17 DIAGNOSIS — D649 Anemia, unspecified: Secondary | ICD-10-CM | POA: Diagnosis not present

## 2016-06-17 DIAGNOSIS — E1101 Type 2 diabetes mellitus with hyperosmolarity with coma: Secondary | ICD-10-CM | POA: Diagnosis present

## 2016-06-17 DIAGNOSIS — E1110 Type 2 diabetes mellitus with ketoacidosis without coma: Secondary | ICD-10-CM | POA: Diagnosis present

## 2016-06-17 DIAGNOSIS — Z9911 Dependence on respirator [ventilator] status: Secondary | ICD-10-CM | POA: Diagnosis not present

## 2016-06-17 DIAGNOSIS — E785 Hyperlipidemia, unspecified: Secondary | ICD-10-CM | POA: Diagnosis present

## 2016-06-17 DIAGNOSIS — Z66 Do not resuscitate: Secondary | ICD-10-CM | POA: Diagnosis not present

## 2016-06-17 DIAGNOSIS — N179 Acute kidney failure, unspecified: Secondary | ICD-10-CM | POA: Diagnosis not present

## 2016-06-17 DIAGNOSIS — I638 Other cerebral infarction: Secondary | ICD-10-CM | POA: Diagnosis not present

## 2016-06-17 DIAGNOSIS — Z9119 Patient's noncompliance with other medical treatment and regimen: Secondary | ICD-10-CM | POA: Diagnosis not present

## 2016-06-17 DIAGNOSIS — E118 Type 2 diabetes mellitus with unspecified complications: Secondary | ICD-10-CM | POA: Diagnosis not present

## 2016-06-17 DIAGNOSIS — I959 Hypotension, unspecified: Secondary | ICD-10-CM | POA: Diagnosis not present

## 2016-06-17 DIAGNOSIS — E876 Hypokalemia: Secondary | ICD-10-CM | POA: Diagnosis not present

## 2016-06-17 DIAGNOSIS — G936 Cerebral edema: Secondary | ICD-10-CM | POA: Diagnosis not present

## 2016-06-17 DIAGNOSIS — F209 Schizophrenia, unspecified: Secondary | ICD-10-CM | POA: Diagnosis present

## 2016-06-17 DIAGNOSIS — R402434 Glasgow coma scale score 3-8, 24 hours or more after hospital admission: Secondary | ICD-10-CM | POA: Diagnosis not present

## 2016-06-17 DIAGNOSIS — Z515 Encounter for palliative care: Secondary | ICD-10-CM | POA: Diagnosis not present

## 2016-06-17 DIAGNOSIS — Z7984 Long term (current) use of oral hypoglycemic drugs: Secondary | ICD-10-CM | POA: Diagnosis not present

## 2016-06-17 DIAGNOSIS — Z79899 Other long term (current) drug therapy: Secondary | ICD-10-CM | POA: Diagnosis not present

## 2016-06-17 LAB — TROPONIN I: Troponin I: 0.04 ng/mL (ref <0.03)

## 2016-06-17 LAB — CBC
HCT: 40.1 % (ref 35.0–47.0)
Hemoglobin: 13.9 g/dL (ref 12.0–16.0)
MCH: 31 pg (ref 26.0–34.0)
MCHC: 34.6 g/dL (ref 32.0–36.0)
MCV: 89.5 fL (ref 80.0–100.0)
PLATELETS: 192 10*3/uL (ref 150–440)
RBC: 4.48 MIL/uL (ref 3.80–5.20)
RDW: 13.9 % (ref 11.5–14.5)
WBC: 12.2 10*3/uL — AB (ref 3.6–11.0)

## 2016-06-17 LAB — BASIC METABOLIC PANEL
Anion gap: 13 (ref 5–15)
Anion gap: 8 (ref 5–15)
Anion gap: 7 (ref 5–15)
BUN: 90 mg/dL — ABNORMAL HIGH (ref 6–20)
BUN: 86 mg/dL — ABNORMAL HIGH (ref 6–20)
BUN: 80 mg/dL — AB (ref 6–20)
CALCIUM: 9.9 mg/dL (ref 8.9–10.3)
CALCIUM: 9.7 mg/dL (ref 8.9–10.3)
CO2: 25 mmol/L (ref 22–32)
CO2: 30 mmol/L (ref 22–32)
CO2: 29 mmol/L (ref 22–32)
CREATININE: 1.34 mg/dL — AB (ref 0.44–1.00)
Calcium: 10.1 mg/dL (ref 8.9–10.3)
Chloride: 110 mmol/L (ref 101–111)
Chloride: 114 mmol/L — ABNORMAL HIGH (ref 101–111)
Chloride: 112 mmol/L — ABNORMAL HIGH (ref 101–111)
Creatinine, Ser: 1.42 mg/dL — ABNORMAL HIGH (ref 0.44–1.00)
Creatinine, Ser: 1.05 mg/dL — ABNORMAL HIGH (ref 0.44–1.00)
GFR, EST AFRICAN AMERICAN: 41 mL/min — AB (ref >60)
GFR, EST AFRICAN AMERICAN: 44 mL/min — AB (ref >60)
GFR, EST AFRICAN AMERICAN: 60 mL/min — AB (ref >60)
GFR, EST NON AFRICAN AMERICAN: 36 mL/min — AB (ref >60)
GFR, EST NON AFRICAN AMERICAN: 38 mL/min — AB (ref >60)
GFR, EST NON AFRICAN AMERICAN: 51 mL/min — AB (ref >60)
Glucose, Bld: 376 mg/dL — ABNORMAL HIGH (ref 65–99)
Glucose, Bld: 83 mg/dL (ref 65–99)
Glucose, Bld: 136 mg/dL — ABNORMAL HIGH (ref 65–99)
Potassium: 2.9 mmol/L — ABNORMAL LOW (ref 3.5–5.1)
Potassium: 3.4 mmol/L — ABNORMAL LOW (ref 3.5–5.1)
Potassium: 4.2 mmol/L (ref 3.5–5.1)
SODIUM: 148 mmol/L — AB (ref 135–145)
SODIUM: 152 mmol/L — AB (ref 135–145)
SODIUM: 148 mmol/L — AB (ref 135–145)

## 2016-06-17 LAB — HEPATIC FUNCTION PANEL
ALBUMIN: 4.4 g/dL (ref 3.5–5.0)
ALK PHOS: 97 U/L (ref 38–126)
ALT: 13 U/L — AB (ref 14–54)
AST: 18 U/L (ref 15–41)
BILIRUBIN TOTAL: 1.1 mg/dL (ref 0.3–1.2)
Bilirubin, Direct: <0.1 mg/dL — ABNORMAL LOW (ref 0.1–0.5)
Total Protein: 8.2 g/dL — ABNORMAL HIGH (ref 6.5–8.1)

## 2016-06-17 LAB — MRSA PCR SCREENING: MRSA BY PCR: NEGATIVE

## 2016-06-17 LAB — GLUCOSE, CAPILLARY
GLUCOSE-CAPILLARY: 234 mg/dL — AB (ref 65–99)
GLUCOSE-CAPILLARY: 101 mg/dL — AB (ref 65–99)
GLUCOSE-CAPILLARY: 132 mg/dL — AB (ref 65–99)
GLUCOSE-CAPILLARY: 167 mg/dL — AB (ref 65–99)
GLUCOSE-CAPILLARY: 197 mg/dL — AB (ref 65–99)
Glucose-Capillary: 533 mg/dL (ref 65–99)
Glucose-Capillary: 358 mg/dL — ABNORMAL HIGH (ref 65–99)
Glucose-Capillary: >600 mg/dL (ref 65–99)
Glucose-Capillary: 221 mg/dL — ABNORMAL HIGH (ref 65–99)
Glucose-Capillary: 104 mg/dL — ABNORMAL HIGH (ref 65–99)
Glucose-Capillary: 89 mg/dL (ref 65–99)
Glucose-Capillary: 355 mg/dL — ABNORMAL HIGH (ref 65–99)

## 2016-06-17 LAB — BLOOD GAS, ARTERIAL
Acid-base deficit: 0.1 mmol/L (ref 0.0–2.0)
Bicarbonate: 23.9 mmol/L (ref 20.0–28.0)
FIO2: 0.21
O2 Saturation: 97.6 %
PCO2 ART: 36 mmHg (ref 32.0–48.0)
PH ART: 7.43 (ref 7.350–7.450)
PO2 ART: 95 mmHg (ref 83.0–108.0)
Patient temperature: 37

## 2016-06-17 LAB — POTASSIUM: Potassium: 4.3 mmol/L (ref 3.5–5.1)

## 2016-06-17 LAB — MAGNESIUM: Magnesium: 2.1 mg/dL (ref 1.7–2.4)

## 2016-06-17 MED ORDER — SODIUM CHLORIDE 0.9 % IV SOLN
INTRAVENOUS | Status: DC
  Administered 2016-06-17: 03:00:00 via INTRAVENOUS

## 2016-06-17 MED ORDER — POTASSIUM CHLORIDE 2 MEQ/ML IV SOLN
30.0000 meq | Freq: Once | INTRAVENOUS | Status: AC
  Administered 2016-06-17: 30 meq via INTRAVENOUS
  Filled 2016-06-17: qty 15

## 2016-06-17 MED ORDER — INSULIN GLARGINE 100 UNIT/ML ~~LOC~~ SOLN
10.0000 [IU] | Freq: Every day | SUBCUTANEOUS | Status: DC
Start: 2016-06-17 — End: 2016-06-19
  Administered 2016-06-17 – 2016-06-18 (×2): 10 [IU] via SUBCUTANEOUS
  Filled 2016-06-17 (×3): qty 0.1

## 2016-06-17 MED ORDER — RISPERIDONE 0.5 MG PO TABS
1.0000 mg | ORAL_TABLET | Freq: Every day | ORAL | Status: DC
  Administered 2016-06-17: 21:00:00 1 mg via ORAL
  Filled 2016-06-17: qty 1

## 2016-06-17 MED ORDER — POTASSIUM CHLORIDE CRYS ER 20 MEQ PO TBCR
40.0000 meq | EXTENDED_RELEASE_TABLET | Freq: Once | ORAL | Status: AC
  Administered 2016-06-17: 40 meq via ORAL
  Filled 2016-06-17: qty 2

## 2016-06-17 MED ORDER — INSULIN ASPART 100 UNIT/ML ~~LOC~~ SOLN
0.0000 [IU] | Freq: Every day | SUBCUTANEOUS | Status: DC
  Administered 2016-06-17: 5 [IU] via SUBCUTANEOUS
  Filled 2016-06-17: qty 5

## 2016-06-17 MED ORDER — ESCITALOPRAM OXALATE 10 MG PO TABS
10.0000 mg | ORAL_TABLET | Freq: Every day | ORAL | Status: DC
  Administered 2016-06-18: 08:00:00 10 mg via ORAL
  Filled 2016-06-17 (×2): qty 1

## 2016-06-17 MED ORDER — INSULIN ASPART 100 UNIT/ML ~~LOC~~ SOLN
0.0000 [IU] | Freq: Three times a day (TID) | SUBCUTANEOUS | Status: DC
  Administered 2016-06-17 (×2): 2 [IU] via SUBCUTANEOUS
  Administered 2016-06-18: 08:00:00 7 [IU] via SUBCUTANEOUS
  Administered 2016-06-18: 5 [IU] via SUBCUTANEOUS
  Filled 2016-06-17: qty 7
  Filled 2016-06-17: qty 2
  Filled 2016-06-17: qty 7
  Filled 2016-06-17: qty 2

## 2016-06-17 MED ORDER — HEPARIN SODIUM (PORCINE) 5000 UNIT/ML IJ SOLN
5000.0000 [IU] | Freq: Three times a day (TID) | INTRAMUSCULAR | Status: DC
  Administered 2016-06-17 – 2016-06-19 (×8): 5000 [IU] via SUBCUTANEOUS
  Filled 2016-06-17 (×8): qty 1

## 2016-06-17 MED ORDER — SODIUM CHLORIDE 0.9 % IV SOLN: INTRAVENOUS | Status: DC

## 2016-06-17 MED ORDER — SODIUM CHLORIDE 0.9 % IV SOLN: INTRAVENOUS | Status: AC

## 2016-06-17 MED ORDER — LISINOPRIL 20 MG PO TABS
20.0000 mg | ORAL_TABLET | Freq: Every day | ORAL | Status: DC
  Administered 2016-06-18: 08:00:00 20 mg via ORAL
  Filled 2016-06-17 (×3): qty 1

## 2016-06-17 MED ORDER — ATORVASTATIN CALCIUM 20 MG PO TABS
20.0000 mg | ORAL_TABLET | Freq: Every day | ORAL | Status: DC
  Administered 2016-06-17 – 2016-06-18 (×2): 20 mg via ORAL
  Filled 2016-06-17 (×2): qty 1

## 2016-06-17 MED ORDER — DEXTROSE-NACL 5-0.45 % IV SOLN
INTRAVENOUS | Status: DC
  Administered 2016-06-17: 07:00:00 via INTRAVENOUS

## 2016-06-17 NOTE — Progress Notes (Signed)
Pt transferred from CCU to our unit.  Pt alert to self only with no complaints, resting comfortably.  Orson Apeanielle Yosiel Thieme, RN

## 2016-06-17 NOTE — H&P (Signed)
[facility] - [place] at [facility]   PATIENT NAME: Laurie Morton    MR#:  [id number]  DATE OF BIRTH:  01-03-1941  DATE OF ADMISSION:  06/15/2016  PRIMARY CARE PHYSICIAN: Tawny Asal, MD   REQUESTING/REFERRING PHYSICIAN: Darnelle Catalan, MD  CHIEF COMPLAINT:   Chief Complaint  Patient presents with  . Hyperglycemia    HISTORY OF PRESENT ILLNESS:  Laurie Morton  is a 76 y.o. female who presents with DKA. Patient's family found her on the floor in her bathroom. They stated that they feel like she crawled there. She has dementia, but is significantly more altered than baseline. Patient herself is unable to contribute to her history of present illness due to her dementia and her current medical state. Hospitalists were called for admission and treatment.  PAST MEDICAL HISTORY:   Past Medical History:  Diagnosis Date  . Dementia   . Diabetes mellitus without complication (HCC)   . Hypertension   . Schizophrenia (HCC)     PAST SURGICAL HISTORY:   Past Surgical History:  Procedure Laterality Date  . ABDOMINAL HYSTERECTOMY    . TONSILLECTOMY      SOCIAL HISTORY:   Social History  Substance Use Topics  . Smoking status: Never Smoker  . Smokeless tobacco: Never Used  . Alcohol use No    FAMILY HISTORY:   Family History  Problem Relation Age of Onset  . CAD Father   . Hypertension Father   . CAD Brother     DRUG ALLERGIES:   Allergies  Allergen Reactions  . Ambien [Zolpidem Tartrate] Other (See Comments)    hallucinations    MEDICATIONS AT HOME:   Prior to Admission medications   Medication Sig Start Date End Date Taking? Authorizing Provider  albuterol (PROVENTIL HFA;VENTOLIN HFA) 108 (90 BASE) MCG/ACT inhaler Inhale 2 puffs into the lungs every 6 (six) hours as needed for wheezing or shortness of breath.   Yes Historical Provider, MD  atorvastatin (LIPITOR) 20 MG tablet Take 20 mg by mouth daily.   Yes Historical Provider, MD   ergocalciferol (VITAMIN D2) 50000 UNITS capsule Take 50,000 Units by mouth once a week.   Yes Historical Provider, MD  escitalopram (LEXAPRO) 10 MG tablet Take 10 mg by mouth daily.   Yes Historical Provider, MD  fenofibrate (TRICOR) 48 MG tablet Take 48 mg by mouth daily.   Yes Historical Provider, MD  gemfibrozil (LOPID) 600 MG tablet Take 600 mg by mouth daily.    Yes Historical Provider, MD  lisinopril (PRINIVIL,ZESTRIL) 20 MG tablet Take 20 mg by mouth daily.   Yes Historical Provider, MD  magnesium oxide (MAG-OX) 400 MG tablet Take 400 mg by mouth daily.   Yes Historical Provider, MD  metFORMIN (GLUCOPHAGE) 500 MG tablet Take 1,000 mg by mouth 2 (two) times daily with a meal.   Yes Historical Provider, MD  risperiDONE (RISPERDAL) 1 MG tablet Take 1 mg by mouth at bedtime.   Yes Historical Provider, MD  citalopram (CELEXA) 10 MG tablet Take 1 tablet (10 mg total) by mouth daily. Patient not taking: Reported on 06/11/2016 03/27/15   Jimmy Footman, MD  cyanocobalamin 1000 MCG tablet Take 1 tablet (1,000 mcg total) by mouth daily. Patient not taking: Reported on 05/31/2016 03/27/15   Jimmy Footman, MD  glimepiride (AMARYL) 2 MG tablet Take 1 tablet (2 mg total) by mouth daily with breakfast. Patient not taking: Reported on 05/28/2016 03/28/15   Jimmy Footman, MD    REVIEW OF SYSTEMS:  Review  of Systems  Unable to perform ROS: Acuity of condition     VITAL SIGNS:   Vitals:   06/15/2016 2151 06/07/2016 2330 06/17/16 0030 06/17/16 0039  BP: (!) 150/77 (!) 182/71 (!) 158/90   Pulse: 97 93 95   Resp: 16 19 19    Temp: 97.7 F (36.5 C)     TempSrc: Oral     SpO2: 100% 95% 100%   Weight: 56.7 kg (125 lb)     Height: 5\' 6"  (1.676 m)   5\' 3"  (1.6 m)   Wt Readings from Last 3 Encounters:  06/13/2016 56.7 kg (125 lb)  03/13/15 54.9 kg (121 lb)  03/12/15 54.9 kg (121 lb)    PHYSICAL EXAMINATION:  Physical Exam  Vitals reviewed. Constitutional: She appears  well-developed and well-nourished. No distress.  HENT:  Head: Normocephalic and atraumatic.  Dry mucous membranes  Eyes: Conjunctivae and EOM are normal. Pupils are equal, round, and reactive to light. No scleral icterus.  Neck: Normal range of motion. Neck supple. No JVD present. No thyromegaly present.  Cardiovascular: Normal rate, regular rhythm and intact distal pulses.  Exam reveals no gallop and no friction rub.   No murmur heard. Respiratory: Effort normal and breath sounds normal. No respiratory distress. She has no wheezes. She has no rales.  GI: Soft. Bowel sounds are normal. She exhibits no distension. There is no tenderness.  Musculoskeletal: Normal range of motion. She exhibits no edema.  No arthritis, no gout  Lymphadenopathy:    She has no cervical adenopathy.  Neurological: She is alert. No cranial nerve deficit.  Unable to fully assess due to patient condition  Skin: Skin is warm and dry. No rash noted. No erythema.  Psychiatric:  Unable to assess due to patient condition    LABORATORY PANEL:   CBC  Recent Labs Lab 06/05/2016 2206  WBC 15.8*  HGB 14.8  HCT 45.1  PLT 213   ------------------------------------------------------------------------------------------------------------------  Chemistries   Recent Labs Lab 06/01/2016 2206  NA 136  K 4.3  CL 91*  CO2 28  GLUCOSE 1,024*  BUN 99*  CREATININE 1.93*  CALCIUM 10.9*  AST 18  ALT 13*  ALKPHOS 97  BILITOT 1.1   ------------------------------------------------------------------------------------------------------------------  Cardiac Enzymes  Recent Labs Lab 05/31/2016 2206  TROPONINI 0.04*   ------------------------------------------------------------------------------------------------------------------  RADIOLOGY:  Ct Head Wo Contrast  Result Date: 06/17/2016 CLINICAL DATA:  Possible syncope. Dementia patient, found on bathroom floor. EXAM: CT HEAD WITHOUT CONTRAST TECHNIQUE: Contiguous  axial images were obtained from the base of the skull through the vertex without intravenous contrast. COMPARISON:  No prior head CT.  Brain MRI 03/22/2015 FINDINGS: Brain: No acute hemorrhage. 11 mm hyperdensity in the inferior right frontal lobe is likely a cavernoma and corresponds to that seen on prior MRI, unchanged in size. No evidence of surrounding edema. No CT findings of acute ischemia. No mass effect or midline shift. No hydrocephalus. Mild atrophy is stable. Periventricular white matter changes, nonspecific but likely chronic small vessel ischemia, stable. No extra-axial fluid collection. Vascular: Atherosclerosis of skullbase vasculature without hyperdense vessel or abnormal calcification. Skull: Normal. Negative for fracture or focal lesion. Sinuses/Orbits: Paranasal sinuses and mastoid air cells are clear. The visualized orbits are unremarkable. Other: None. IMPRESSION: 1. No acute abnormality. 2. Mild chronic small vessel ischemia. 3. 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 Electronically Signed   By: Rubye Oaks M.D.   On: 06/17/2016 00:23   Dg Chest Portable 1  View  Result Date: 06/17/2016 CLINICAL DATA:  Possible syncope. Dementia patient with hyperglycemia, found on bathroom floor. EXAM: PORTABLE CHEST 1 VIEW COMPARISON:  Frontal and lateral views 11/23/2012 FINDINGS: The cardiomediastinal contours are normal. Calcified granuloma and mediastinal nodes. Pulmonary vasculature is normal. No consolidation, pleural effusion, or pneumothorax. No acute osseous abnormalities are seen. The bones appear under mineralized. IMPRESSION: No acute abnormality. Electronically Signed   By: Rubye OaksMelanie  Ehinger M.D.   On: 06/17/2016 00:08    EKG:   Orders placed or performed during the hospital encounter of 11/11/16  . EKG 12-Lead  . EKG 12-Lead  . ED EKG  . ED EKG    IMPRESSION AND PLAN:  Principal Problem:   DKA (diabetic ketoacidoses) (HCC) - we  will admit her per DKA protocol admission order set, with IV insulin, serial lab checks. Active Problems:   Essential hypertension - stable, continue home meds   Dementia - unclear baseline at this point, this will need to be further elucidated when family is available   Dyslipidemia - continue home meds  All the records are reviewed and case discussed with ED provider. Management plans discussed with the patient and/or family.  DVT PROPHYLAXIS: SubQ heparin  GI PROPHYLAXIS: None  ADMISSION STATUS: Inpatient  CODE STATUS: Full, this is per chart review, family will need to be contacted about this when they're available as the patient is currently unable to have this conversation. Code Status History    Date Active Date Inactive Code Status Order ID Comments User Context   03/14/2015 11:04 AM 03/28/2015  7:47 PM Full Code [id number]  Jimmy FootmanAndrea Hernandez-Gonzalez, MD Inpatient      TOTAL TIME TAKING CARE OF THIS PATIENT: 45 minutes.    Jaice Digioia FIELDING 06/17/2016, 1:20 AM  [facility]  Office  [phone]  CC: Primary care physician; Tawny AsalMEYERS,TRACY, MD

## 2016-06-17 NOTE — Progress Notes (Signed)
PHARMACY - CRITICAL CARE PROGRESS NOTE  Pharmacy Consult for Electrolyte Monitoring   Allergies  Allergen Reactions  . Ambien [Zolpidem Tartrate] Other (See Comments)    hallucinations    Patient Measurements: Height: 5\' 3"  (160 cm) Weight: 125 lb (56.7 kg) IBW/kg (Calculated) : 52.4  Vital Signs: Temp: 97.3 F (36.3 C) (03/27 0800) Temp Source: Axillary (03/27 0800) BP: 152/65 (03/27 1500) Pulse Rate: 84 (03/27 1500) Intake/Output from previous day: 03/26 0701 - 03/27 0700 In: 662.2 [I.V.:397.2; IV Piggyback:265] Out: -  Intake/Output from this shift: Total I/O In: 540.8 [I.V.:540.8] Out: -  Vent settings for last 24 hours:    Labs:  Recent Labs  05/25/2016 2206 06/17/16 0417 06/17/16 0803 06/17/16 1127  WBC 15.8* 12.2*  --   --   HGB 14.8 13.9  --   --   HCT 45.1 40.1  --   --   PLT 213 192  --   --   CREATININE 1.93* 1.42* 1.34* 1.05*  MG  --   --   --  2.1  ALBUMIN 4.4  --   --   --   PROT 8.2*  --   --   --   AST 18  --   --   --   ALT 13*  --   --   --   ALKPHOS 97  --   --   --   BILITOT 1.1  --   --   --   BILIDIR <0.1*  --   --   --   IBILI NOT CALCULATED  --   --   --    Estimated Creatinine Clearance: 39.5 mL/min (A) (by C-G formula based on SCr of 1.05 mg/dL (H)).  Potassium  Date Value Ref Range Status  06/17/2016 4.2 3.5 - 5.1 mmol/L Final  06/17/2016 4.3 3.5 - 5.1 mmol/L Final  11/23/2012 4.1 3.5 - 5.1 mmol/L Final      Recent Labs  06/17/16 1002 06/17/16 1114 06/17/16 1209  GLUCAP 101* 132* 167*    Microbiology: Recent Results (from the past 720 hour(s))  MRSA PCR Screening     Status: None   Collection Time: 06/17/16  3:03 AM  Result Value Ref Range Status   MRSA by PCR NEGATIVE NEGATIVE Final    Comment:        The GeneXpert MRSA Assay (FDA approved for NASAL specimens only), is one component of a comprehensive MRSA colonization surveillance program. It is not intended to diagnose MRSA infection nor to guide  or monitor treatment for MRSA infections.     Assessment: 76 y/o F admitted with DKA and started on insulin drip.   Plan:  Electrolytes WNL after potassium supplementation. Will f/u AM labs.   Luisa HartChristy, Rubena Roseman D 06/17/2016,4:03 PM

## 2016-06-17 NOTE — Progress Notes (Signed)
Inpatient Diabetes Program Recommendations  AACE/ADA: New Consensus Statement on Inpatient Glycemic Control (2015)  Target Ranges:  Prepandial:   less than 140 mg/dL      Peak postprandial:   less than 180 mg/dL (1-2 hours)      Critically ill patients:  140 - 180 mg/dL   Results for Laurie Morton, Laurie Morton (MRN [id number]) as of 06/17/2016 08:14  Ref. Range 06/14/2016 22:06  Sodium Latest Ref Range: 135 - 145 mmol/L 136  Potassium Latest Ref Range: 3.5 - 5.1 mmol/L 4.3  Chloride Latest Ref Range: 101 - 111 mmol/L 91 (L)  CO2 Latest Ref Range: 22 - 32 mmol/L 28  Glucose Latest Ref Range: 65 - 99 mg/dL 4,0981,024 (HH)  BUN Latest Ref Range: 6 - 20 mg/dL 99 (Morton)  Creatinine Latest Ref Range: 0.44 - 1.00 mg/dL 1.191.93 (Morton)  Calcium Latest Ref Range: 8.9 - 10.3 mg/dL 14.710.9 (Morton)  Anion gap Latest Ref Range: 5 - 15  17 (Morton)    Admit with: Hyperglycemia  History: DM, Dementia, Schizophrenia  Home DM Meds: Metformin 1000 mg BID       Amaryl 2 mg daily (not taking Amaryl per Home Med Rec)  Per last PCP visit (07/17/15), pt was supposed to be taking Amaryl 8 mg daily  Current Insulin Orders: IV Insulin drip     Per Chart Review, it looks as if pt saw her PCP (Dr. Tasia Catchingsracy Myers with [facility]) on 07/17/15.  At that visit, pt admitted to not taking her oral DM meds on a regular basis.  Was having issues with her living arrangements (felt like she was being passed from one child to another even though her children were trying to provide 24 hour care for her due to her dementia issues).  At that most recent PCP visit, pt's MD encouraged her to continue to take her meds as prescribed (Metformin 1000 mg BID + Amaryl 8 mg daily)  I would assume with her memory/dementia issues, that the patient is likely not taking her meds as prescribed unless her family is making sure she is taking the medications.    MD- When patient ready to transition off the IV Insulin drip (CO2 >20, Anion Gap <12), please consider the  following:  1. Start Lantus 10 units daily (give 1st dose Lantus at least 1 hour before IV Insulin drip stopped)  (This would be ~0.2 units/kg dosing)  2. Start Novolog Sensitive Correction Scale/ SSI (0-9 units) TID AC + HS  3. Check Current Hemoglobin A1c level- Last one on file was 11.3% back in April 2017 per PCP notes     --Will follow patient during hospitalization--  Ambrose FinlandJeannine Johnston Tighe Gitto RN, MSN, CDE Diabetes Coordinator Inpatient Glycemic Control Team Team Pager: [phone] (8a-5p)

## 2016-06-17 NOTE — Progress Notes (Signed)
[facility] - [place] at [facility]   PATIENT NAME: Laurie Morton    MR#:  [id number]  DATE OF BIRTH:  June 18, 1940  SUBJECTIVE:   Patient is here due to acute diabetic ketoacidosis. Blood sugars much improved, off insulin drip. AG. Closed. No acute complaints presently.   REVIEW OF SYSTEMS:    Review of Systems  Unable to perform ROS: Mental acuity    Nutrition: Carb control Tolerating Diet: Yes Tolerating PT: Await Eval.   DRUG ALLERGIES:   Allergies  Allergen Reactions  . Ambien [Zolpidem Tartrate] Other (See Comments)    hallucinations    VITALS:  Blood pressure (!) 143/75, pulse 85, temperature 97.3 F (36.3 C), temperature source Axillary, resp. rate 20, height 5\' 3"  (1.6 m), weight 56.7 kg (125 lb), SpO2 100 %.  PHYSICAL EXAMINATION:   Physical Exam  GENERAL:  76 y.o.-year-old patient lying in the bed in NAD.  EYES: Pupils equal, round, reactive to light and accommodation. No scleral icterus. Extraocular muscles intact.  HEENT: Head atraumatic, normocephalic. Oropharynx and nasopharynx clear.  NECK:  Supple, no jugular venous distention. No thyroid enlargement, no tenderness.  LUNGS: Normal breath sounds bilaterally, no wheezing, rales, rhonchi. No use of accessory muscles of respiration.  CARDIOVASCULAR: S1, S2 normal. No murmurs, rubs, or gallops.  ABDOMEN: Soft, nontender, nondistended. Bowel sounds present. No organomegaly or mass.  EXTREMITIES: No cyanosis, clubbing or edema b/l.    NEUROLOGIC: Cranial nerves II through XII are intact. No focal Motor or sensory deficits b/l.  Globally weak. PSYCHIATRIC: The patient is alert and oriented x 1. Flat affect SKIN: No obvious rash, lesion, or ulcer.    LABORATORY PANEL:   CBC  Recent Labs Lab 06/17/16 0417  WBC 12.2*  HGB 13.9  HCT 40.1  PLT 192   ------------------------------------------------------------------------------------------------------------------  Chemistries    Recent Labs Lab 06/01/2016 2206  06/17/16 1127  NA 136  < > 148*  K 4.3  < > 4.2  4.3  CL 91*  < > 112*  CO2 28  < > 29  GLUCOSE 1,024*  < > 136*  BUN 99*  < > 80*  CREATININE 1.93*  < > 1.05*  CALCIUM 10.9*  < > 9.7  MG  --   --  2.1  AST 18  --   --   ALT 13*  --   --   ALKPHOS 97  --   --   BILITOT 1.1  --   --   < > = values in this interval not displayed. ------------------------------------------------------------------------------------------------------------------  Cardiac Enzymes  Recent Labs Lab 05/29/2016 2206  TROPONINI 0.04*   ------------------------------------------------------------------------------------------------------------------  RADIOLOGY:  Ct Head Wo Contrast  Result Date: 06/17/2016 CLINICAL DATA:  Possible syncope. Dementia patient, found on bathroom floor. EXAM: CT HEAD WITHOUT CONTRAST TECHNIQUE: Contiguous axial images were obtained from the base of the skull through the vertex without intravenous contrast. COMPARISON:  No prior head CT.  Brain MRI 03/22/2015 FINDINGS: Brain: No acute hemorrhage. 11 mm hyperdensity in the inferior right frontal lobe is likely a cavernoma and corresponds to that seen on prior MRI, unchanged in size. No evidence of surrounding edema. No CT findings of acute ischemia. No mass effect or midline shift. No hydrocephalus. Mild atrophy is stable. Periventricular white matter changes, nonspecific but likely chronic small vessel ischemia, stable. No extra-axial fluid collection. Vascular: Atherosclerosis of skullbase vasculature without hyperdense vessel or abnormal calcification. Skull: Normal. Negative for fracture or focal lesion. Sinuses/Orbits: Paranasal sinuses and mastoid  air cells are clear. The visualized orbits are unremarkable. Other: None. IMPRESSION: 1. No acute abnormality. 2. Mild chronic small vessel ischemia. 3. 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 Electronically  Signed   By: Rubye OaksMelanie  Ehinger M.D.   On: 06/17/2016 00:23   Dg Chest Portable 1 View  Result Date: 06/17/2016 CLINICAL DATA:  Possible syncope. Dementia patient with hyperglycemia, found on bathroom floor. EXAM: PORTABLE CHEST 1 VIEW COMPARISON:  Frontal and lateral views 11/23/2012 FINDINGS: The cardiomediastinal contours are normal. Calcified granuloma and mediastinal nodes. Pulmonary vasculature is normal. No consolidation, pleural effusion, or pneumothorax. No acute osseous abnormalities are seen. The bones appear under mineralized. IMPRESSION: No acute abnormality. Electronically Signed   By: Rubye OaksMelanie  Ehinger M.D.   On: 06/17/2016 00:08     ASSESSMENT AND PLAN:   76 year old female with past medical history of schizophrenia, hypertension, diabetes, dementia presented to the hospital due to acute diabetic ketoacidosis.  1. Acute diabetic ketoacidosis-secondary to medical noncompliance. Patient refuses take her insulin at home. -Was on insulin drip, but now weaned off of it. Anion gap is closed. -Appreciate diabetes coronary input, transitioned to Lantus and NovoLog with meals and sliding scale insulin coverage for now. -Started on a carb-controlled diet, currently no nausea and vomiting.  2. Hypokalemia-secondary to the acidosis and insulin drip. -Improved with supplementation.  3. hypertension-continue lisinopril.  4. History of schizophrenia-continue Risperdal.  5. Depression - cont. Lexapro.   Transfer to floor, D/C tele, PT eval.  All the records are reviewed and case discussed with Care Management/Social Worker. Management plans discussed with the patient, family and they are in agreement.  CODE STATUS: Full code  DVT Prophylaxis: Hep. SQ  TOTAL TIME TAKING CARE OF THIS PATIENT: 30 minutes.   POSSIBLE D/C IN 1-2 DAYS, DEPENDING ON CLINICAL CONDITION.   Houston SirenSAINANI,Pope Brunty J M.D on 06/17/2016 at 2:32 PM  Between 7am to 6pm - Pager - [phone]  After 6pm go to  [email or web address] - Social research officer, governmentpassword [email or web address]  [facility] [place] [facility]  Office  [phone]  CC: Primary care physician; Tawny AsalMEYERS,TRACY, MD

## 2016-06-17 NOTE — Care Management Note (Signed)
Case Management Note  Patient Details  Name: Laurie Morton MRN: [id number] Date of Birth: 09-10-1940  Subjective/Objective:                  Met with patient and one of her sons to discuss discharge planning. Erlene Quan is the son at bedside and answered most of my questions. He states patient has dementia. Erlene Quan holds POA but not HCPOA (his brother Gaspar Bidding holds that). Patient is currently living with Erlene Quan and has been "since a behavioral issue she had a few months ago". Her PCP is in [place] but she does not want to go to any PCP. She has a history of seeing a behavioral health physician in [place]. She is independent at home per [facility]. He is concerned with placement for patient. PT requested.   Action/Plan: List of home health agencies along with private duty agencies provided to [facility] for review. I explained that [employer] typically does not pay for long-term care. RNCM to follow.   Expected Discharge Date:                  Expected Discharge Plan:     In-House Referral:     Discharge planning Services  CM Consult  Post Acute Care Choice:  Durable Medical Equipment, Home Health Choice offered to:  Patient, Adult Children  DME Arranged:    DME Agency:     HH Arranged:    [place] Agency:     Status of Service:  In process, will continue to follow  If discussed at Long Length of Stay Meetings, dates discussed:    Additional Comments:  Marshell Garfinkel, RN 06/17/2016, 11:18 AM

## 2016-06-17 NOTE — Evaluation (Signed)
Physical Therapy Evaluation Patient Details Name: Ardine EngCarolyn H Goodridge MRN: [id number] DOB: 16-Feb-1941 Today's Date: 06/17/2016   History of Present Illness  76 year old female with past medical history of schizophrenia, hypertension, diabetes, dementia presented to the hospital due to acute diabetic ketoacidosis.  She was found on the bathroom floor by her family.  Clinical Impression  Pt with flat affect and confusion t/o the PT exam. She was able to answer simple questions intermittently but could not give appropriate background info or relate PLOF.  She was very difficult to get to initiate movement and despite much cuing and encouragement needed constant cuing and assist to stay safe and on task.  Overall pt struggled with general awareness and was completely unsafe to do activities on her own.  Pt will need d4/7 supervision at this time and would benefit from STR to work on mobility, ambulation, balance and safety limitations.     Follow Up Recommendations SNF    Equipment Recommendations  Rolling walker with 5" wheels    Recommendations for Other Services       Precautions / Restrictions Precautions Precautions: Fall Restrictions Weight Bearing Restrictions: No      Mobility  Bed Mobility Overal bed mobility: Needs Assistance Bed Mobility: Supine to Sit;Sit to Supine     Supine to sit: Min assist Sit to supine: Min assist   General bed mobility comments: Pt is able show effort getting out/into bed but is slow, labored and needs heavy encouragement and occasional phyiscal cuing to complete the task  Transfers Overall transfer level: Needs assistance Equipment used: Rolling walker (2 wheeled) Transfers: Sit to/from Stand Sit to Stand: Min assist         General transfer comment: Pt again struggles to follow instruction/initiate movement but did not need excessive assist to phyiscally rise once PT able to give imputus.  Upon standing up she had a loss of bladder control,  with copius urine though she did not appear to notice.  Ambulation/Gait Ambulation/Gait assistance: Min assist Ambulation Distance (Feet): 45 Feet Assistive device: Rolling walker (2 wheeled)       General Gait Details: Initially attempted to ambulate w/o AD, pt unable to organize putting 1 foot in front of the other w/o excessive cuing and HHA "pulling" the proper direction.  With walker pt did better initiating some movement, but consitantly veered to the R and despite heavy cuing was unable to go straight or even avoid obstacles.  Pt needed constant assist and cuing to stay on task.  Pt did have another loss of bladder control while walking though she again did not appear to notice.   Stairs            Wheelchair Mobility    Modified Rankin (Stroke Patients Only)       Balance                                             Pertinent Vitals/Pain Pain Assessment:  (unable to rate, does not appear to be in pain)    Home Living Family/patient expects to be discharged to:: Unsure Living Arrangements: Children               Additional Comments: Pt unable to effectively report living situation, appears she lives with son but is home alone during the day?    Prior Function  Comments: again pt struggles to give much information, reports she does not use a walker, able to do in home ambulation?     Hand Dominance        Extremity/Trunk Assessment   Upper Extremity Assessment Upper Extremity Assessment: Generalized weakness;Difficult to assess due to impaired cognition    Lower Extremity Assessment Lower Extremity Assessment: Generalized weakness;Difficult to assess due to impaired cognition       Communication   Communication: No difficulties  Cognition   Behavior During Therapy: Flat affect Overall Cognitive Status: Difficult to assess                                 General Comments: Pt with distant stare  much of the time, struggles to follow even simple instruction      General Comments      Exercises     Assessment/Plan    PT Assessment Patient needs continued PT services  PT Problem List Decreased strength;Decreased range of motion;Decreased activity tolerance;Decreased balance;Decreased mobility;Decreased coordination;Decreased cognition;Decreased knowledge of use of DME;Decreased safety awareness;Decreased knowledge of precautions       PT Treatment Interventions DME instruction;Gait training;Functional mobility training;Therapeutic activities;Therapeutic exercise;Balance training;Neuromuscular re-education;Cognitive remediation;Patient/family education    PT Goals (Current goals can be found in the Care Plan section)  Acute Rehab PT Goals PT Goal Formulation: Patient unable to participate in goal setting Time For Goal Achievement: 07/01/16 Potential to Achieve Goals: Fair    Frequency Min 2X/week   Barriers to discharge        Co-evaluation               End of Session Equipment Utilized During Treatment: Gait belt Activity Tolerance:  (limited ability to participate secondary to mental status) Patient left: with bed alarm set;with call bell/phone within reach;with nursing/sitter in room Nurse Communication: Mobility status PT Visit Diagnosis: Muscle weakness (generalized) (M62.81);Difficulty in walking, not elsewhere classified (R26.2)    Time: 1610-9604 PT Time Calculation (min) (ACUTE ONLY): 27 min   Charges:   PT Evaluation $PT Eval Low Complexity: 1 Procedure     PT G Codes:          Malachi Pro, DPT 06/17/2016, 4:46 PM

## 2016-06-17 NOTE — Progress Notes (Signed)
Notified patients son, Apolinar JunesBrandon that home medications were sent to pharmacy to be locked up.

## 2016-06-18 ENCOUNTER — Inpatient Hospital Stay: Payer: Medicare HMO

## 2016-06-18 ENCOUNTER — Inpatient Hospital Stay (HOSPITAL_COMMUNITY)
Admit: 2016-06-18 | Discharge: 2016-06-18 | Disposition: A | Discharge status: Home / Self Care | Payer: Medicare HMO | Attending: Pulmonary Disease | Admitting: Pulmonary Disease

## 2016-06-18 DIAGNOSIS — R4189 Other symptoms and signs involving cognitive functions and awareness: Secondary | ICD-10-CM

## 2016-06-18 DIAGNOSIS — J9600 Acute respiratory failure, unspecified whether with hypoxia or hypercapnia: Secondary | ICD-10-CM

## 2016-06-18 DIAGNOSIS — R402434 Glasgow coma scale score 3-8, 24 hours or more after hospital admission: Secondary | ICD-10-CM

## 2016-06-18 DIAGNOSIS — Z452 Encounter for adjustment and management of vascular access device: Secondary | ICD-10-CM

## 2016-06-18 DIAGNOSIS — Z978 Presence of other specified devices: Secondary | ICD-10-CM

## 2016-06-18 DIAGNOSIS — R40243 Glasgow coma scale score 3-8, unspecified time: Secondary | ICD-10-CM

## 2016-06-18 LAB — BASIC METABOLIC PANEL
ANION GAP: 5 (ref 5–15)
BUN: 59 mg/dL — ABNORMAL HIGH (ref 6–20)
CHLORIDE: 110 mmol/L (ref 101–111)
CO2: 25 mmol/L (ref 22–32)
Calcium: 9.1 mg/dL (ref 8.9–10.3)
Creatinine, Ser: 1.12 mg/dL — ABNORMAL HIGH (ref 0.44–1.00)
GFR calc non Af Amer: 48 mL/min — ABNORMAL LOW (ref >60)
GFR, EST AFRICAN AMERICAN: 55 mL/min — AB (ref >60)
GLUCOSE: 438 mg/dL — AB (ref 65–99)
POTASSIUM: 4.1 mmol/L (ref 3.5–5.1)
Sodium: 140 mmol/L (ref 135–145)

## 2016-06-18 LAB — CBC
HEMATOCRIT: 31 % — AB (ref 35.0–47.0)
HEMOGLOBIN: 10.9 g/dL — AB (ref 12.0–16.0)
MCH: 32 pg (ref 26.0–34.0)
MCHC: 35.1 g/dL (ref 32.0–36.0)
MCV: 91 fL (ref 80.0–100.0)
Platelets: 138 10*3/uL — ABNORMAL LOW (ref 150–440)
RBC: 3.41 MIL/uL — ABNORMAL LOW (ref 3.80–5.20)
RDW: 13.6 % (ref 11.5–14.5)
WBC: 7.8 10*3/uL (ref 3.6–11.0)

## 2016-06-18 LAB — BLOOD GAS, ARTERIAL
ACID-BASE EXCESS: 2.8 mmol/L — AB (ref 0.0–2.0)
BICARBONATE: 25.1 mmol/L (ref 20.0–28.0)
FIO2: 40
O2 SAT: 99.6 %
PCO2 ART: 30 mmHg — AB (ref 32.0–48.0)
PEEP/CPAP: 5 cmH2O
PH ART: 7.53 — AB (ref 7.350–7.450)
Patient temperature: 37
RATE: 16 resp/min
VT: 450 mL
pO2, Arterial: 156 mmHg — ABNORMAL HIGH (ref 83.0–108.0)

## 2016-06-18 LAB — URINE CULTURE
CULTURE: NO GROWTH
SPECIAL REQUESTS: NORMAL

## 2016-06-18 LAB — GLUCOSE, CAPILLARY
GLUCOSE-CAPILLARY: 280 mg/dL — AB (ref 65–99)
GLUCOSE-CAPILLARY: 288 mg/dL — AB (ref 65–99)
GLUCOSE-CAPILLARY: 266 mg/dL — AB (ref 65–99)
Glucose-Capillary: 338 mg/dL — ABNORMAL HIGH (ref 65–99)
Glucose-Capillary: 269 mg/dL — ABNORMAL HIGH (ref 65–99)

## 2016-06-18 LAB — TROPONIN I: Troponin I: 0.05 ng/mL (ref <0.03)

## 2016-06-18 LAB — TRIGLYCERIDES: TRIGLYCERIDES: 583 mg/dL — AB (ref <150)

## 2016-06-18 MED ORDER — NOREPINEPHRINE BITARTRATE 1 MG/ML IV SOLN
0.0000 ug/min | INTRAVENOUS | Status: DC
  Administered 2016-06-18: 5 ug/min via INTRAVENOUS
  Filled 2016-06-18: qty 16

## 2016-06-18 MED ORDER — FENTANYL CITRATE (PF) 100 MCG/2ML IJ SOLN
50.0000 ug | INTRAMUSCULAR | Status: DC | PRN
  Filled 2016-06-18: qty 2

## 2016-06-18 MED ORDER — ORAL CARE MOUTH RINSE
15.0000 mL | Freq: Four times a day (QID) | OROMUCOSAL | Status: DC
  Administered 2016-06-19 (×4): 15 mL via OROMUCOSAL

## 2016-06-18 MED ORDER — DOCUSATE SODIUM 50 MG/5ML PO LIQD: 100.0000 mg | Freq: Two times a day (BID) | ORAL | Status: DC | PRN

## 2016-06-18 MED ORDER — FENTANYL CITRATE (PF) 100 MCG/2ML IJ SOLN: 25.0000 ug | INTRAMUSCULAR | Status: DC | PRN

## 2016-06-18 MED ORDER — ASPIRIN 81 MG PO CHEW: 81.0000 mg | CHEWABLE_TABLET | Freq: Every day | ORAL | Status: DC

## 2016-06-18 MED ORDER — IOPAMIDOL (ISOVUE-370) INJECTION 76%
75.0000 mL | Freq: Once | INTRAVENOUS | Status: AC | PRN
  Administered 2016-06-18: 75 mL via INTRAVENOUS

## 2016-06-18 MED ORDER — HYDRALAZINE HCL 20 MG/ML IJ SOLN
10.0000 mg | INTRAMUSCULAR | Status: DC | PRN
  Administered 2016-06-19: 10 mg via INTRAVENOUS
  Filled 2016-06-18: qty 1

## 2016-06-18 MED ORDER — ROCURONIUM BROMIDE 50 MG/5ML IV SOLN
INTRAVENOUS | Status: AC
  Administered 2016-06-18: 50 mg
  Filled 2016-06-18: qty 1

## 2016-06-18 MED ORDER — FENTANYL CITRATE (PF) 100 MCG/2ML IJ SOLN: 50.0000 ug | INTRAMUSCULAR | Status: DC | PRN

## 2016-06-18 MED ORDER — INSULIN ASPART 100 UNIT/ML ~~LOC~~ SOLN
0.0000 [IU] | SUBCUTANEOUS | Status: DC
  Administered 2016-06-18: 2 [IU] via SUBCUTANEOUS
  Administered 2016-06-18: 8 [IU] via SUBCUTANEOUS
  Administered 2016-06-18: 2 [IU] via SUBCUTANEOUS
  Administered 2016-06-19: 8 [IU] via SUBCUTANEOUS
  Administered 2016-06-19 (×2): 3 [IU] via SUBCUTANEOUS
  Administered 2016-06-19: 2 [IU] via SUBCUTANEOUS
  Filled 2016-06-18 (×2): qty 2
  Filled 2016-06-18: qty 3
  Filled 2016-06-18: qty 2
  Filled 2016-06-18: qty 3
  Filled 2016-06-18 (×2): qty 8

## 2016-06-18 MED ORDER — CHLORHEXIDINE GLUCONATE 0.12% ORAL RINSE (MEDLINE KIT)
15.0000 mL | Freq: Two times a day (BID) | OROMUCOSAL | Status: DC
  Administered 2016-06-18 – 2016-06-19 (×2): 15 mL via OROMUCOSAL

## 2016-06-18 MED ORDER — ATORVASTATIN CALCIUM 20 MG PO TABS
20.0000 mg | ORAL_TABLET | Freq: Every day | ORAL | Status: DC
  Administered 2016-06-19: 20 mg
  Filled 2016-06-18: qty 1

## 2016-06-18 MED ORDER — SODIUM CHLORIDE 0.9 % IV SOLN
INTRAVENOUS | Status: DC
  Administered 2016-06-18 (×2): via INTRAVENOUS

## 2016-06-18 MED ORDER — ASPIRIN 300 MG RE SUPP
300.0000 mg | Freq: Every day | RECTAL | Status: DC
  Administered 2016-06-18: 300 mg via RECTAL
  Filled 2016-06-18: qty 1

## 2016-06-18 MED ORDER — PROPOFOL 1000 MG/100ML IV EMUL: 0.0000 ug/kg/min | INTRAVENOUS | Status: DC

## 2016-06-18 MED ORDER — FAMOTIDINE IN NACL 20-0.9 MG/50ML-% IV SOLN
20.0000 mg | INTRAVENOUS | Status: DC
  Administered 2016-06-18: 20 mg via INTRAVENOUS
  Filled 2016-06-18: qty 50

## 2016-06-18 NOTE — Clinical Social Work Placement (Signed)
   CLINICAL SOCIAL WORK PLACEMENT  NOTE  Date:  06/18/2016  Patient Details  Name: Laurie Morton MRN: [id number] Date of Birth: [birth year]  Clinical Social Work is seeking post-discharge placement for this patient at the Skilled  Nursing Facility level of care (*CSW will initial, date and re-position this form in  chart as items are completed):  Yes   Patient/family provided with [place] Clinical Social Work Department's list of facilities offering this level of care within the geographic area requested by the patient (or if unable, by the patient's family).  Yes   Patient/family informed of their freedom to choose among providers that offer the needed level of care, that participate in Medicare, Medicaid or managed care program needed by the patient, have an available bed and are willing to accept the patient.  Yes   Patient/family informed of [facility]'s ownership interest in [facility] and [facility], as well as of the fact that they are under no obligation to receive care at these facilities.  PASRR submitted to EDS on 06/18/16     PASRR number received on       Existing PASRR number confirmed on       FL2 transmitted to all facilities in geographic area requested by pt/family on 06/18/16     FL2 transmitted to all facilities within larger geographic area on       Patient informed that his/her managed care company has contracts with or will negotiate with certain facilities, including the following:            Patient/family informed of bed offers received.  Patient chooses bed at       Physician recommends and patient chooses bed at      Patient to be transferred to   on  .  Patient to be transferred to facility by       Patient family notified on   of transfer.  Name of family member notified:        PHYSICIAN       Additional Comment:    _______________________________________________ Dede QuerySarah Kayler Buckholtz, LCSW 06/18/2016, 11:55 AM

## 2016-06-18 NOTE — Progress Notes (Signed)
   06/18/16 1220  Vitals  BP (!) 58/32  MAP (mmHg) (!) 37  BP Method Automatic  Pulse Rate 67  Pulse Rate Source Monitor  Oxygen Therapy  SpO2 97 %   This nurse in pt's room to give 1200 meds.  Pt unresponsive.  Pt's blood pressure very low, CBG is 288.  Rapid response and Dr. Cherlynn KaiserSainani called.  Pt transferred to CCU per Dr. Cherlynn KaiserSainani and family contacted by Dr. Cherlynn KaiserSainani.

## 2016-06-18 NOTE — Progress Notes (Signed)
Inpatient Diabetes Program Recommendations  AACE/ADA: New Consensus Statement on Inpatient Glycemic Control (2015)  Target Ranges:  Prepandial:   less than 140 mg/dL      Peak postprandial:   less than 180 mg/dL (1-2 hours)      Critically ill patients:  140 - 180 mg/dL   Results for Laurie Morton, Laurie Morton (MRN [id number]) as of 06/18/2016 08:48  Ref. Range 06/17/2016 11:14 06/17/2016 12:09 06/17/2016 16:55 06/17/2016 20:35 06/18/2016 07:52  Glucose-Capillary Latest Ref Range: 65 - 99 mg/dL 409132 (Morton) 811167 (Morton) 914197 (Morton) 355 (Morton) 338 (Morton)    History: DM, Dementia, Schizophrenia  Home DM Meds: Metformin 1000 mg BID                             Amaryl 2 mg daily (not taking Amaryl per Home Med Rec)  Per last PCP visit (07/17/15), pt was supposed to be taking Amaryl 8 mg daily  Current Insulin Orders: Lantus 10 units daily      Novolog Sensitive Correction Scale/ SSI (0-9 units) TID AC + HS      Per Chart Review, it looks as if pt saw her PCP (Dr. Tasia Catchingsracy Myers with [facility]) on 07/17/15.  At that visit, pt admitted to not taking her oral DM meds on a regular basis.  Was having issues with her living arrangements (felt like she was being passed from one child to another even though her children were trying to provide 24 hour care for her due to her dementia issues).  At that most recent PCP visit, pt's MD encouraged her to continue to take her meds as prescribed (Metformin 1000 mg BID + Amaryl 8 mg daily)  I would assume with her memory/dementia issues, that the patient is likely not taking her meds as prescribed unless her family is making sure she is taking the medications.    MD- Please consider the following in-hospital insulin adjustments:  1. Increase Lantus to 15 units daily (0.3 units/kg dosing)  2. Start Novolog Meal Coverage: Novolog 3 units TID with meals (hold if pt eats <50% of meal)  3. Please order Hemoglobin A1c so we can assess her current glucose levels at home.  4. MD-  Do you think patient will need insulin at time of discharge?  If so, family will have to be available to give all medications since patient appears to not have been taking any of her meds prior to admission.     --Will follow patient during hospitalization--  Ambrose FinlandJeannine Johnston Rimsha Trembley RN, MSN, CDE Diabetes Coordinator Inpatient Glycemic Control Team Team Pager: [phone] (8a-5p)

## 2016-06-18 NOTE — Progress Notes (Signed)
Pt. Was transported to CT again from CCU while on the vent.

## 2016-06-18 NOTE — Clinical Social Work Placement (Signed)
   CLINICAL SOCIAL WORK PLACEMENT  NOTE  Date:  06/18/2016  Patient Details  Name: Laurie Morton MRN: [id number] Date of Birth: 07-26-1940  Clinical Social Work is seeking post-discharge placement for this patient at the Skilled  Nursing Facility level of care (*CSW will initial, date and re-position this form in  chart as items are completed):  Yes   Patient/family provided with [place] Clinical Social Work Department's list of facilities offering this level of care within the geographic area requested by the patient (or if unable, by the patient's family).  Yes   Patient/family informed of their freedom to choose among providers that offer the needed level of care, that participate in Medicare, Medicaid or managed care program needed by the patient, have an available bed and are willing to accept the patient.  Yes   Patient/family informed of 's ownership interest in [facility] and [facility], as well as of the fact that they are under no obligation to receive care at these facilities.  PASRR submitted to EDS on       PASRR number received on       Existing PASRR number confirmed on       FL2 transmitted to all facilities in geographic area requested by pt/family on 06/18/16     FL2 transmitted to all facilities within larger geographic area on       Patient informed that his/her managed care company has contracts with or will negotiate with certain facilities, including the following:            Patient/family informed of bed offers received.  Patient chooses bed at       Physician recommends and patient chooses bed at      Patient to be transferred to   on  .  Patient to be transferred to facility by       Patient family notified on   of transfer.  Name of family member notified:        PHYSICIAN       Additional Comment:    _______________________________________________ Dede QuerySarah Stokely Jeancharles, LCSW 06/18/2016, 11:50 AM

## 2016-06-18 NOTE — Progress Notes (Signed)
PHARMACY - CRITICAL CARE PROGRESS NOTE  Pharmacy Consult for Electrolyte Monitoring   Allergies  Allergen Reactions  . Ambien [Zolpidem Tartrate] Other (See Comments)    hallucinations    Patient Measurements: Height: 5\' 4"  (162.6 cm) Weight: 117 lb 6.4 oz (53.3 kg) IBW/kg (Calculated) : 54.7  Vital Signs: Temp: 98.5 F (36.9 C) (03/28 0436) BP: 109/48 (03/28 0436) Pulse Rate: 86 (03/28 0436) Intake/Output from previous day: 03/27 0701 - 03/28 0700 In: 540.8 [I.V.:540.8] Out: -  Intake/Output from this shift: No intake/output data recorded. Vent settings for last 24 hours:    Labs:  Recent Labs  08-01-16 2206 06/17/16 0417 06/17/16 0803 06/17/16 1127 06/18/16 0505  WBC 15.8* 12.2*  --   --  7.8  HGB 14.8 13.9  --   --  10.9*  HCT 45.1 40.1  --   --  31.0*  PLT 213 192  --   --  138*  CREATININE 1.93* 1.42* 1.34* 1.05* 1.12*  MG  --   --   --  2.1  --   ALBUMIN 4.4  --   --   --   --   PROT 8.2*  --   --   --   --   AST 18  --   --   --   --   ALT 13*  --   --   --   --   ALKPHOS 97  --   --   --   --   BILITOT 1.1  --   --   --   --   BILIDIR <0.1*  --   --   --   --   IBILI NOT CALCULATED  --   --   --   --    Estimated Creatinine Clearance: 37.6 mL/min (A) (by C-G formula based on SCr of 1.12 mg/dL (H)).  Potassium  Date Value Ref Range Status  06/18/2016 4.1 3.5 - 5.1 mmol/L Final  11/23/2012 4.1 3.5 - 5.1 mmol/L Final      Recent Labs  06/17/16 1655 06/17/16 2035 06/18/16 0752  GLUCAP 197* 355* 338*    Microbiology: Recent Results (from the past 720 hour(s))  Urine culture     Status: None   Collection Time: 08-01-16 10:49 PM  Result Value Ref Range Status   Specimen Description URINE, CATHETERIZED  Final   Special Requests Normal  Final   Culture   Final    NO GROWTH Performed at [facility], [address] [place], [place] [address]    Report Status 06/18/2016 FINAL  Final  MRSA PCR Screening     Status: None   Collection Time: 06/17/16  3:03 AM  Result Value Ref Range Status   MRSA by PCR NEGATIVE NEGATIVE Final    Comment:        The GeneXpert MRSA Assay (FDA approved for NASAL specimens only), is one component of a comprehensive MRSA colonization surveillance program. It is not intended to diagnose MRSA infection nor to guide or monitor treatment for MRSA infections.     Assessment: 76 y/o F admitted with DKA and started on insulin drip. Insulin gtt now discontinued and patient has been transitioned to lantus and novolog   Plan:  Electrolytes WNL. Pharmacy will sign off of consult. Please re consult pharmacy for assistance managing this patient's electrolytes.   Jakia Kennebrew D 06/18/2016,10:12 AM

## 2016-06-18 NOTE — Progress Notes (Addendum)
Notified Dr. Thad Rangereynolds of result of CT angio of neck and head.  No new orders at this time.  Patient withdraws with pain- Patient not requiring sedation at this time- pt off levophed- since around 1500- foley in place- urine output adequate.  Attempted to advance OG- was unsuccessful- will relay this information to night RN so that she can attempt.

## 2016-06-18 NOTE — Clinical Social Work Note (Signed)
Clinical Social Work Assessment  Patient Details  Name: Laurie Morton MRN: [id number] Date of Birth: 1940/12/09  Date of referral:  06/18/16               Reason for consult:  Facility Placement, Discharge Planning                Permission sought to share information with:  Family Supports Permission granted to share information::  Yes, Verbal Permission Granted  Name::     Laurie Morton  Relationship::  son  Contact Information:  [phone]  Housing/Transportation Living arrangements for the past 2 months:  Single Family Home Source of Information:  Adult Children Patient Interpreter Needed:  None Criminal Activity/Legal Involvement Pertinent to Current Situation/Hospitalization:  No - Comment as needed Significant Relationships:  Adult Children Lives with:  Self Do you feel safe going back to the place where you live?  No (Pt is in need of a higher level of care. ) Need for family participation in patient care:  Yes (Comment)  Care giving concerns:  Pt is in need of a higher level of care.   Social Worker assessment / plan:  CSW spoke with pt's son, Laurie Morton, on the phone as pt is only oriented to self and has a history of dementia. CSW introduced herself and explained role of social work. CSW also explained role of social work. CSW also explained the process of discharging to SNF. Pt's son is in agreement with discharge plan. CSW initiated SNF search and will follow up with bed offers.   Prior to writing note, however after assessment. Pt was transferred to ICU. CSW updated ICU CSW. CSW will continue to follow.   Employment status:  Retired Database administratornsurance information:  Managed Medicare PT Recommendations:  Skilled Nursing Facility Information / Referral to community resources:  Skilled Nursing Facility  Patient/Family's Response to care:  Pt's son was appreciative   Patient/Family's Understanding of and Emotional Response to Diagnosis, Current Treatment, and Prognosis:  Pt's  son understands that pt will need STR prior to returning home.   Emotional Assessment Appearance:  Appears stated age Attitude/Demeanor/Rapport:  Other Affect (typically observed):   Other Orientation:  Oriented to Self Alcohol / Substance use:  Not Applicable Psych involvement (Current and /or in the community):  No (Comment)  Discharge Needs  Concerns to be addressed:  Adjustment to Illness Readmission within the last 30 days:  No Current discharge risk:  Chronically ill Barriers to Discharge:  Continued Medical Work up   Laurie RxSarah Azariya Freeman, LCSW 06/18/2016, 3:01 PM

## 2016-06-18 NOTE — Progress Notes (Signed)
[facility] - [place] [facility]   PATIENT NAME: Laurie Morton    MR#:  [id number]  DATE OF BIRTH:  1940/05/31  SUBJECTIVE:   Patient is here due to acute diabetic ketoacidosis. Off insulin gtt and transferred to floor yesterday. Remains non-verbal but follows some simple commands.   REVIEW OF SYSTEMS:    Review of Systems  Unable to perform ROS: Mental acuity    Nutrition: Carb control Tolerating Diet: Yes Tolerating PT: Eval noted.  DRUG ALLERGIES:   Allergies  Allergen Reactions  . Ambien [Zolpidem Tartrate] Other (See Comments)    hallucinations    VITALS:  Blood pressure (!) 157/47, pulse (!) 56, temperature 98.5 F (36.9 C), temperature source Oral, resp. rate 16, height 5\' 4"  (1.626 m), weight 52.8 kg (116 lb 8 oz), SpO2 100 %.  PHYSICAL EXAMINATION:   Physical Exam  GENERAL:  76 y.o.-year-old patient lying in the bed nonverbal but in NAD.  EYES: Pupils equal, round, reactive to light and accommodation. No scleral icterus. Extraocular muscles intact.  HEENT: Head atraumatic, normocephalic. Oropharynx and nasopharynx clear.  NECK:  Supple, no jugular venous distention. No thyroid enlargement, no tenderness.  LUNGS: Normal breath sounds bilaterally, no wheezing, rales, rhonchi. No use of accessory muscles of respiration.  CARDIOVASCULAR: S1, S2 normal. No murmurs, rubs, or gallops.  ABDOMEN: Soft, nontender, nondistended. Bowel sounds present. No organomegaly or mass.  EXTREMITIES: No cyanosis, clubbing or edema b/l.    NEUROLOGIC: Cranial nerves II through XII are intact. No focal Motor or sensory deficits b/l.  Globally weak. PSYCHIATRIC: The patient is alert and oriented x 1. Flat affect SKIN: No obvious rash, lesion, or ulcer.    LABORATORY PANEL:   CBC  Recent Labs Lab 06/18/16 0505  WBC 7.8  HGB 10.9*  HCT 31.0*  PLT 138*    ------------------------------------------------------------------------------------------------------------------  Chemistries   Recent Labs Lab 05/27/2016 2206  06/17/16 1127 06/18/16 0505  NA 136  < > 148* 140  K 4.3  < > 4.2  4.3 4.1  CL 91*  < > 112* 110  CO2 28  < > 29 25  GLUCOSE 1,024*  < > 136* 438*  BUN 99*  < > 80* 59*  CREATININE 1.93*  < > 1.05* 1.12*  CALCIUM 10.9*  < > 9.7 9.1  MG  --   --  2.1  --   AST 18  --   --   --   ALT 13*  --   --   --   ALKPHOS 97  --   --   --   BILITOT 1.1  --   --   --   < > = values in this interval not displayed. ------------------------------------------------------------------------------------------------------------------  Cardiac Enzymes  Recent Labs Lab 06/10/2016 2206  TROPONINI 0.04*   ------------------------------------------------------------------------------------------------------------------  RADIOLOGY:  Ct Head Wo Contrast  Result Date: 06/18/2016 CLINICAL DATA:  Unresponsive patient. History of schizophrenia, hypertension, diabetes and dementia . EXAM: CT HEAD WITHOUT CONTRAST TECHNIQUE: Contiguous axial images were obtained from the base of the skull through the vertex without intravenous contrast. COMPARISON:  05/26/2016 FINDINGS: Brain: The patient has developed geographic areas of low-density throughout the cerebellum consistent with multiple cerebellar infarctions. The pons is also low-density in there is probably some involvement of the brainstem. These areas show mild swelling but no hemorrhage. There is new low density also seen in the right occipital lobe consistent with PCA territory infarction. The remainder the brain serve by the anterior circulation  shows chronic atrophy and small vessel disease but no acute finding. No mass lesion. No hydrocephalus. No extra-axial collection. Vascular: Extensive atherosclerotic calcification of the major vessels at the base of the brain. Skull: Normal Sinuses/Orbits:  Clear/normal Other: None significant IMPRESSION: Areas of low-density now throughout the cerebellum, affecting the pons and affecting the right occipital lobe consistent with acute infarctions. This either represents posterior circulation embolic disease or thrombotic disease. No hemorrhage. Electronically Signed   By: Paulina Fusi M.D.   On: 06/18/2016 16:19   Ct Head Wo Contrast  Result Date: 06/17/2016 CLINICAL DATA:  Possible syncope. Dementia patient, found on bathroom floor. EXAM: CT HEAD WITHOUT CONTRAST TECHNIQUE: Contiguous axial images were obtained from the base of the skull through the vertex without intravenous contrast. COMPARISON:  No prior head CT.  Brain MRI 03/22/2015 FINDINGS: Brain: No acute hemorrhage. 11 mm hyperdensity in the inferior right frontal lobe is likely a cavernoma and corresponds to that seen on prior MRI, unchanged in size. No evidence of surrounding edema. No CT findings of acute ischemia. No mass effect or midline shift. No hydrocephalus. Mild atrophy is stable. Periventricular white matter changes, nonspecific but likely chronic small vessel ischemia, stable. No extra-axial fluid collection. Vascular: Atherosclerosis of skullbase vasculature without hyperdense vessel or abnormal calcification. Skull: Normal. Negative for fracture or focal lesion. Sinuses/Orbits: Paranasal sinuses and mastoid air cells are clear. The visualized orbits are unremarkable. Other: None. IMPRESSION: 1. No acute abnormality. 2. Mild chronic small vessel ischemia. 3. 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 Electronically Signed   By: Rubye Oaks M.D.   On: 06/17/2016 00:23   Dg Chest Port 1 View  Result Date: 06/18/2016 CLINICAL DATA:  Central line and endotracheal tube placement. EXAM: PORTABLE CHEST 1 VIEW COMPARISON:  06/09/2016. FINDINGS: Endotracheal tube terminates 2.7 cm above the carina. Right IJ central line tip projects over the low  SVC. No pneumothorax. Heart size normal. Calcified mediastinal and hilar lymph nodes. Lungs are somewhat low in volume. Probable granuloma in the right midlung zone. Lungs are otherwise clear. No pleural fluid. IMPRESSION: 1. Satisfactory endotracheal tube and right IJ central line placement. No pneumothorax. 2. No acute findings in the chest. Electronically Signed   By: Leanna Battles M.D.   On: 06/18/2016 15:04   Dg Chest Portable 1 View  Result Date: 06/17/2016 CLINICAL DATA:  Possible syncope. Dementia patient with hyperglycemia, found on bathroom floor. EXAM: PORTABLE CHEST 1 VIEW COMPARISON:  Frontal and lateral views 11/23/2012 FINDINGS: The cardiomediastinal contours are normal. Calcified granuloma and mediastinal nodes. Pulmonary vasculature is normal. No consolidation, pleural effusion, or pneumothorax. No acute osseous abnormalities are seen. The bones appear under mineralized. IMPRESSION: No acute abnormality. Electronically Signed   By: Rubye Oaks M.D.   On: 06/17/2016 00:08     ASSESSMENT AND PLAN:   76 year old female with past medical history of schizophrenia, hypertension, diabetes, dementia presented to the hospital due to acute diabetic ketoacidosis.  1. Acute diabetic ketoacidosis-secondary to medical noncompliance. Patient refuses take her insulin at home. -Was on insulin drip, but now weaned off of it. Anion gap is closed. -Appreciate diabetes coronary input, transitioned to Lantus and NovoLog with meals and sliding scale insulin coverage for now. - BS stable.   2. Hypokalemia-secondary to the acidosis and insulin drip. -Improved with supplementation.  3. hypertension-continue lisinopril.  4. History of schizophrenia-continue Risperdal.  5. Depression - cont. Lexapro.   PT eval noted and likely needs  SNF upon discharge and Social Work aware.   All the records are reviewed and case discussed with Care Management/Social Worker. Management plans discussed with  the patient, family and they are in agreement.  CODE STATUS: Full code  DVT Prophylaxis: Hep. SQ  TOTAL TIME TAKING CARE OF THIS PATIENT: 30 minutes.   POSSIBLE D/C IN 1-2 DAYS, DEPENDING ON CLINICAL CONDITION.   [place] SirenSAINANI,Donyelle Enyeart J M.D on 06/18/2016 at 4:51 PM  Between 7am to 6pm - Pager - [phone]  After 6pm go to [email or web address] - Social research officer, governmentpassword EPAS [facility]  [facility]  Office  [phone]  CC: Primary care physician; Tawny AsalMEYERS,TRACY, MD

## 2016-06-18 NOTE — Procedures (Signed)
Central Venous Catheter Insertion Procedure Note Laurie Morton [id number] Jan 03, 1941  Procedure: Insertion of Central Venous Catheter Indications: Assessment of intravascular volume, Drug and/or fluid administration and Frequent blood sampling  Procedure Details Consent: Risks of procedure as well as the alternatives and risks of each were explained to the (patient/caregiver).  Consent for procedure obtained. Time Out: Verified patient identification, verified procedure, site/side was marked, verified correct patient position, special equipment/implants available, medications/allergies/relevent history reviewed, required imaging and test results available.  Performed  Maximum sterile technique was used including antiseptics, cap, gloves, gown, hand hygiene, mask and sheet. Skin prep: Chlorhexidine; local anesthetic administered A antimicrobial bonded/coated triple lumen catheter was placed in the right internal jugular vein using the Seldinger technique.  Evaluation Blood flow good Complications: No apparent complications Patient did tolerate procedure well. Chest X-ray ordered to verify placement.  CXR: pending.  Right internal jugular central line placed utilizing ultrasound by Acute Care NP Student, Harlon DittyJeremiah Keene with direct supervision no complications noted during or following procedure cxr pending.  Laurie Morton, AGNP  Pulmonary/Critical Care Pager [phone] (please enter 7 digits) PCCM Consult Pager [phone] (please enter 7 digits)  Billy Fischeravid Vyctoria Dickman, MD PCCM service Mobile [phone] Pager [phone] 06/18/2016

## 2016-06-18 NOTE — Progress Notes (Signed)
A rapid response was called on the pt. As she was unresponsive and noted to be severely hypotensive.  Physical exam:  Gen- pt. Unresponsive and not responding to verbal stimuli or sternal rub.  Pupils fixed and pinpoint Heart - S1, S2, no murmurs Lungs - CTA b/l no rales, rhonchi, wheezes Neuro - neuropathic, unresponsive.  Vitals:   06/18/16 1300 06/18/16 1400 06/18/16 1500 06/18/16 1600  BP: (!) 107/51 114/67 139/61 (!) 157/47  Pulse: 84 74 76 (!) 56  Resp: 16 13 16 16   Temp:    98.5 F (36.9 C)  TempSrc:    Oral  SpO2: 98% 100% 100% 100%  Weight:    52.8 kg (116 lb 8 oz)  Height:    5\' 4"  (1.626 m)   Assessment & Plan  1. AMS/Unresponsiveness - etiology unclear.   - Will get STAT ABG, CT head.  - transfer to ICU and likely needs to be intubated for airway protection. Discussed w/ DR. Simonds on the phone.   2. Hypotension - aggressive IV fluid hydration - May need IV VAsopressors.    Discussed plan of care with Intensivist, and also the family over the phone.   Critical Care Time Spent: 30 min.

## 2016-06-18 NOTE — Consult Note (Signed)
PULMONARY / CRITICAL CARE MEDICINE   Name: Laurie Morton MRN: [id number] DOB: 01-Dec-1940    ADMISSION DATE:  06/06/2016 CONSULTATION DATE:  06/18/2016  REFERRING MD:  Dr. Verdell Carmine  CHIEF COMPLAINT:  Altered Mental Status and Hyperglycemia  HISTORY OF PRESENT ILLNESS:   Laurie Morton is a 76 y.o. Female with a PMH of Schizophrenia, HTN, DM, Dementia.  She presented to [facility] on 06[facility]06[facility]2016  with c/o Altered Mental Status after being found on the bathroom floor by her family.  She was admitted to [place] unit for treatment of DKA and Altered Mental Status.  She was transferred out to Med/surg unit on 3/27.  On 3/28, she was found  unresponsive, hypotension, and bradypneic on Med/surg unit.   Therefore she was transferred to ICU for emergent intubation.  She was given IV Narcan with no improvement of unresponsiveness.  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  PCCM is consulted for further management of Acute Encephalopathy and Acute Respiratory Failure requiring mechanical ventilation.  PAST MEDICAL HISTORY :  She  has a past medical history of Dementia; Diabetes mellitus without complication ([place]); Hypertension; and Schizophrenia ([place]).  PAST SURGICAL HISTORY: She  has a past surgical history that includes Abdominal hysterectomy and Tonsillectomy.  Allergies  Allergen Reactions  . Ambien [Zolpidem Tartrate] Other (See Comments)    hallucinations    No current facility-administered medications on file prior to encounter.    Current Outpatient Prescriptions on File Prior to Encounter  Medication Sig  . albuterol (PROVENTIL HFA;VENTOLIN HFA) 108 (90 BASE) MCG/ACT inhaler Inhale 2 puffs into the lungs every 6 (six) hours as needed for wheezing or shortness of breath.  Marland Kitchen atorvastatin (LIPITOR) 20 MG tablet Take 20 mg by mouth daily.  . ergocalciferol (VITAMIN D2) 50000 UNITS capsule Take 50,000 Units by mouth once a week.  .  fenofibrate (TRICOR) 48 MG tablet Take 48 mg by mouth daily.  Marland Kitchen gemfibrozil (LOPID) 600 MG tablet Take 600 mg by mouth daily.   . citalopram (CELEXA) 10 MG tablet Take 1 tablet (10 mg total) by mouth daily. (Patient not taking: Reported on 05/23/2016)  . cyanocobalamin 1000 MCG tablet Take 1 tablet (1,000 mcg total) by mouth daily. (Patient not taking: Reported on 06/07/2016)  . glimepiride (AMARYL) 2 MG tablet Take 1 tablet (2 mg total) by mouth daily with breakfast. (Patient not taking: Reported on 05/24/2016)    FAMILY HISTORY:  Her indicated that the status of her father is unknown. She indicated that the status of her brother is unknown.    SOCIAL HISTORY: She  reports that she has never smoked. She has never used smokeless tobacco. She reports that she does not drink alcohol or use drugs.  REVIEW OF SYSTEMS:   Unable to assess due to unresponsiveness  SUBJECTIVE:  Unable to assess due to unresponsiveness  VITAL SIGNS: BP (!) 99/55   Pulse 79   Temp 98.2 F (36.8 C) (Oral)   Resp 16   Ht 5' 4"  (1.626 m)   Wt 116 lb 8 oz (52.8 kg)   SpO2 100%   BMI 20.00 kg/m   HEMODYNAMICS:    VENTILATOR SETTINGS: Vent Mode: PRVC FiO2 (%):  [30 %-40 %] 30 % Set Rate:  [16 bmp] 16 bmp Vt Set:  [450 mL] 450 mL PEEP:  [5 cmH20] 5 cmH20  INTAKE / OUTPUT: I/O last 3 completed shifts: In: 724.9 [I.V.:674.9; IV Piggyback:50] Out: 2202 [RKYHC:6237]  PHYSICAL EXAMINATION: General:  Acutely ill-appearing, caucasian female, intubated, lying in bed Neuro:  Unresponsive, Pupils Pinpoint & Nonreactive HEENT:  Atraumatic, Normocephalic, Neck supple, No JVD Cardiovascular:  RRR, s1s2 noted, No M/R/G Lungs:  Diminished throughout, symmetrical expansion, no accessory muscle use Abdomen:  BS + x4, Soft, Non-tender, Non-distended Musculoskeletal:  Deep Tendon Reflexes absent, No edema Skin:  Dry, intact, No rashes, lesions, or ulcerations.  LABS:  BMET  Recent Labs Lab 06/17/16 0803  06/17/16 1127 06/18/16 0505  NA 152* 148* 140  K 3.4* 4.2  4.3 4.1  CL 114* 112* 110  CO2 30 29 25   BUN 86* 80* 59*  CREATININE 1.34* 1.05* 1.12*  GLUCOSE 83 136* 438*    Electrolytes  Recent Labs Lab 06/17/16 0803 06/17/16 1127 06/18/16 0505  CALCIUM 9.9 9.7 9.1  MG  --  2.1  --     CBC  Recent Labs Lab 05/22/2016 2206 06/17/16 0417 06/18/16 0505  WBC 15.8* 12.2* 7.8  HGB 14.8 13.9 10.9*  HCT 45.1 40.1 31.0*  PLT 213 192 138*    Coag's No results for input(s): APTT, INR in the last 168 hours.  Sepsis Markers No results for input(s): LATICACIDVEN, PROCALCITON, O2SATVEN in the last 168 hours.  ABG  Recent Labs Lab 06/17/16 0007 06/18/16 1530  PHART 7.43 7.53*  PCO2ART 36 30*  PO2ART 95 156*    Liver Enzymes  Recent Labs Lab 05/23/2016 2206  AST 18  ALT 13*  ALKPHOS 97  BILITOT 1.1  ALBUMIN 4.4    Cardiac Enzymes  Recent Labs Lab 06/07/2016 2206 06/18/16 1900  TROPONINI 0.04* 0.05*    Glucose  Recent Labs Lab 06/17/16 2035 06/18/16 0752 06/18/16 1144 06/18/16 1224 06/18/16 1241 06/18/16 1617  GLUCAP 355* 338* 280* 288* 266* 269*    Imaging Ct Angio Head W Or Wo Contrast  Result Date: 06/18/2016 CLINICAL DATA:  Unresponsive, acute diabetic ketoacidosis. History of hypertension, diabetes, schizophrenia and dementia. EXAM: CT ANGIOGRAPHY HEAD AND NECK TECHNIQUE: Multidetector CT imaging of the head and neck was performed using the standard protocol during bolus administration of intravenous contrast. Multiplanar CT image reconstructions and MIPs were obtained to evaluate the vascular anatomy. Carotid stenosis measurements (when applicable) are obtained utilizing NASCET criteria, using the distal internal carotid diameter as the denominator. CONTRAST:  75 cc Isovue 370 COMPARISON:  CT HEAD June 18, 2016 at 1544 hours and CT HEAD June 16, 2016 FINDINGS: CTA NECK AORTIC ARCH: Normal appearance of the thoracic arch, normal branch pattern.  The origins of the innominate, left Common carotid artery and subclavian artery are patent, moderate intimal thickening calcific atherosclerosis. Mild stenosis distal RIGHT subclavian artery due to calcific atherosclerosis. RIGHT CAROTID SYSTEM: Common carotid artery is widely patent, coursing in a straight line fashion. Normal appearance of the carotid bifurcation without hemodynamically significant stenosis by NASCET criteria, mild eccentric atherosclerosis. Normal appearance of the included internal carotid artery. LEFT CAROTID SYSTEM: Common carotid artery is widely patent, coursing in a straight line fashion. Normal appearance of the carotid bifurcation without hemodynamically significant stenosis by NASCET criteria, mild eccentric atherosclerosis. Normal appearance of the included internal carotid artery. VERTEBRAL ARTERIES:Left vertebral artery is dominant. Normal appearance of the vertebral arteries, which appear widely patent. SKELETON: No acute osseous process though bone windows have not been submitted. Moderate to severe C5-6 and C6-7 degenerative disc. OTHER NECK: Soft tissues of the neck are non-acute though, not tailored for evaluation. Endotracheal tube tip 1-2 cm above the carina. Fluid distended esophagus, incompletely  characterized. RIGHT internal jugular central venous catheter, distal tip not included. Small calcified mediastinal lymph nodes. CTA HEAD ANTERIOR CIRCULATION: Patent cervical internal carotid arteries, petrous, cavernous and supra clinoid internal carotid arteries. Moderate stenosis RIGHT supraclinoid internal carotid artery. LEFT A1 segment is dominant. Widely patent anterior communicating artery. Normal appearance of the anterior and middle cerebral arteries. No large vessel occlusion, hemodynamically significant stenosis, dissection, luminal irregularity, contrast extravasation or aneurysm. POSTERIOR CIRCULATION: Widely patent vertebral arteries, vertebrobasilar junction and  basilar artery, as well as main branch vessels. Mild atherosclerosis bilateral V4 segments. Bilateral posterior cerebral artery is patent. Tandem mild stenoses RIGHT P1 segment. No large vessel occlusion, hemodynamically significant stenosis, dissection, contrast extravasation or aneurysm. VENOUS SINUSES: Major dural venous sinuses are patent though not tailored for evaluation on this angiographic examination. ANATOMIC VARIANTS: None. DELAYED PHASE: Not performed. MIP images reviewed. IMPRESSION: CTA NECK: No acute vascular process or hemodynamically significant stenosis. Fluid distended esophagus concerning for achalasia presents aspiration risk. CTA HEAD:  No emergent large vessel occlusion or severe stenosis. Moderate stenosis RIGHT supraclinoid internal carotid artery. Mild intracranial atherosclerosis. Electronically Signed   By: Elon Alas M.D.   On: 06/18/2016 17:56   Ct Head Wo Contrast  Result Date: 06/18/2016 CLINICAL DATA:  Unresponsive patient. History of schizophrenia, hypertension, diabetes and dementia . EXAM: CT HEAD WITHOUT CONTRAST TECHNIQUE: Contiguous axial images were obtained from the base of the skull through the vertex without intravenous contrast. COMPARISON:  05/31/2016 FINDINGS: Brain: The patient has developed geographic areas of low-density throughout the cerebellum consistent with multiple cerebellar infarctions. The pons is also low-density in there is probably some involvement of the brainstem. These areas show mild swelling but no hemorrhage. There is new low density also seen in the right occipital lobe consistent with PCA territory infarction. The remainder the brain serve by the anterior circulation shows chronic atrophy and small vessel disease but no acute finding. No mass lesion. No hydrocephalus. No extra-axial collection. Vascular: Extensive atherosclerotic calcification of the major vessels at the base of the brain. Skull: Normal Sinuses/Orbits: Clear/normal  Other: None significant IMPRESSION: Areas of low-density now throughout the cerebellum, affecting the pons and affecting the right occipital lobe consistent with acute infarctions. This either represents posterior circulation embolic disease or thrombotic disease. No hemorrhage. Electronically Signed   By: Nelson Chimes M.D.   On: 06/18/2016 16:19   Ct Angio Neck W Or Wo Contrast  Result Date: 06/18/2016 CLINICAL DATA:  Unresponsive, acute diabetic ketoacidosis. History of hypertension, diabetes, schizophrenia and dementia. EXAM: CT ANGIOGRAPHY HEAD AND NECK TECHNIQUE: Multidetector CT imaging of the head and neck was performed using the standard protocol during bolus administration of intravenous contrast. Multiplanar CT image reconstructions and MIPs were obtained to evaluate the vascular anatomy. Carotid stenosis measurements (when applicable) are obtained utilizing NASCET criteria, using the distal internal carotid diameter as the denominator. CONTRAST:  75 cc Isovue 370 COMPARISON:  CT HEAD June 18, 2016 at 1544 hours and CT HEAD June 16, 2016 FINDINGS: CTA NECK AORTIC ARCH: Normal appearance of the thoracic arch, normal branch pattern. The origins of the innominate, left Common carotid artery and subclavian artery are patent, moderate intimal thickening calcific atherosclerosis. Mild stenosis distal RIGHT subclavian artery due to calcific atherosclerosis. RIGHT CAROTID SYSTEM: Common carotid artery is widely patent, coursing in a straight line fashion. Normal appearance of the carotid bifurcation without hemodynamically significant stenosis by NASCET criteria, mild eccentric atherosclerosis. Normal appearance of the included internal carotid artery. LEFT CAROTID SYSTEM: Common carotid  artery is widely patent, coursing in a straight line fashion. Normal appearance of the carotid bifurcation without hemodynamically significant stenosis by NASCET criteria, mild eccentric atherosclerosis. Normal appearance of  the included internal carotid artery. VERTEBRAL ARTERIES:Left vertebral artery is dominant. Normal appearance of the vertebral arteries, which appear widely patent. SKELETON: No acute osseous process though bone windows have not been submitted. Moderate to severe C5-6 and C6-7 degenerative disc. OTHER NECK: Soft tissues of the neck are non-acute though, not tailored for evaluation. Endotracheal tube tip 1-2 cm above the carina. Fluid distended esophagus, incompletely characterized. RIGHT internal jugular central venous catheter, distal tip not included. Small calcified mediastinal lymph nodes. CTA HEAD ANTERIOR CIRCULATION: Patent cervical internal carotid arteries, petrous, cavernous and supra clinoid internal carotid arteries. Moderate stenosis RIGHT supraclinoid internal carotid artery. LEFT A1 segment is dominant. Widely patent anterior communicating artery. Normal appearance of the anterior and middle cerebral arteries. No large vessel occlusion, hemodynamically significant stenosis, dissection, luminal irregularity, contrast extravasation or aneurysm. POSTERIOR CIRCULATION: Widely patent vertebral arteries, vertebrobasilar junction and basilar artery, as well as main branch vessels. Mild atherosclerosis bilateral V4 segments. Bilateral posterior cerebral artery is patent. Tandem mild stenoses RIGHT P1 segment. No large vessel occlusion, hemodynamically significant stenosis, dissection, contrast extravasation or aneurysm. VENOUS SINUSES: Major dural venous sinuses are patent though not tailored for evaluation on this angiographic examination. ANATOMIC VARIANTS: None. DELAYED PHASE: Not performed. MIP images reviewed. IMPRESSION: CTA NECK: No acute vascular process or hemodynamically significant stenosis. Fluid distended esophagus concerning for achalasia presents aspiration risk. CTA HEAD:  No emergent large vessel occlusion or severe stenosis. Moderate stenosis RIGHT supraclinoid internal carotid artery. Mild  intracranial atherosclerosis. Electronically Signed   By: Elon Alas M.D.   On: 06/18/2016 17:56   Dg Chest Port 1 View  Result Date: 06/18/2016 CLINICAL DATA:  Central line and endotracheal tube placement. EXAM: PORTABLE CHEST 1 VIEW COMPARISON:  06/01/2016. FINDINGS: Endotracheal tube terminates 2.7 cm above the carina. Right IJ central line tip projects over the low SVC. No pneumothorax. Heart size normal. Calcified mediastinal and hilar lymph nodes. Lungs are somewhat low in volume. Probable granuloma in the right midlung zone. Lungs are otherwise clear. No pleural fluid. IMPRESSION: 1. Satisfactory endotracheal tube and right IJ central line placement. No pneumothorax. 2. No acute findings in the chest. Electronically Signed   By: Lorin Picket M.D.   On: 06/18/2016 15:04     STUDIES:  CT Head 3/28>> Areas of low-density now throughout the cerebellum, affecting the pons and affecting the right occipital lobe consistent with acute infarctions. This either represents posterior circulation embolic disease or thrombotic disease. No hemorrhage.  CULTURES: None  ANTIBIOTICS: None  SIGNIFICANT EVENTS: 3/26>> Admission to [facility] Stepdown unit for DKA 3/27>> Transfer out to Med/surg unit 3/28>> Rapid response called for unresponsiveness, hypotension, and bradypnea.  Transfer to ICU, emergently intubated.  CT Head shows acute ischemic infarctions involving the pons and right occipital lobe.  LINES/TUBES: 3/28>> Right IJ TLC 3/38>> 7.5 ETT  DISCUSSION: Laurie Morton is a 76 y.o. Female admitted to [facility] stepdown unit for DKA and AMS.  She was transferred to Med/surg unit on 3/27.  On 3/28 she was found unresponsive, hypotensive, and bradypnea, therefore was transferred to ICU and emergently intubated.  CT Head on 3/28 is concerning for acute ischemic CVA involving the pons and right occipital lobe.  ASSESSMENT / PLAN:  PULMONARY A: Acute Respiratory Failure secondary Acute Ischemic CVA   P:   Full Vent Support Vent Bundle  Maintain O2 sats >92% SBT when parameters met   CARDIOVASCULAR A:  Hypotension>>Resolved Hx: HTN  P:  Continuous Telemetry Monitoring Keep SBP <150 & DBP<100, prn Hydralazine if needed to maintain BP goal Obtain Echocardiogram 3/28 Obtain Carotid Ultrasound 3/28 Trend Troponin's NS @ 50 ml/hr  RENAL A:   Acute Renal Failure  P:   Monitor I&O's Follow BMP's Replace electrolytes as indicated per protocol  GASTROINTESTINAL A:   No Acute Issues  P:   Keep NPO for now Pepcid for SUP  HEMATOLOGIC A:   Anemia  P:  Monitor for S/Sx of bleeding Follow CBC's Transfuse for Hgb <7 per protocol Heparin SQ for VTE prophylaxis  INFECTIOUS A:   No acute issues  P:   Monitor Fever curve Maintain Normothermia, prn tylenol if needed Follow WBC's  ENDOCRINE A:   Hyperglycemia Hx: DM    P:   CBG q4h Continue SSI q4h Follow Hypo/Hyperglycemia protocol  NEUROLOGIC A:   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 PRES Hx: Dementia  P:   RASS goal: 0 to -1 Propofol gtt & Fentanyl pushes to maintain RASS goal Daily WUA Continue ASA & Lipitor Neurology Consulted, appreciate input May need MRI   FAMILY  - Updates: No family currently at bedside   CCM time: 45 mins The above time includes time spent in consultation with patient and/or family members and reviewing care plan on multidisciplinary rounds  Merton Border, MD PCCM service Mobile [phone] Pager [phone]     06/18/2016, 10:10 PM

## 2016-06-18 NOTE — Progress Notes (Signed)
CH responded to a RR for room 112. Pt is being attended to by RR team. MD ordered Pt to IC15. MD contacted family to update. CH offered silent prayer. CH will monitor and is available for follow up as needed.    06/18/16 1200  Clinical Encounter Type  Visited With Patient;Health care provider  Visit Type Initial;Code  Referral From Nurse  Spiritual Encounters  Spiritual Needs Prayer

## 2016-06-18 NOTE — Procedures (Signed)
Oral Intubation Procedure Note  Indications: Respiratory insufficiency Consent: Unable to obtain consent because of altered level of consciousness. Time Out: Verified patient identification, verified procedure, site/side was marked, verified correct patient position, special equipment/implants available, medications/allergies/relevent history reviewed, required imaging and test results available.   Pre-meds: Etomidate 20 mg IV  Neuromuscular blockade: Rocuronium 50 mg IV  Laryngoscope: #3 MAC  Visualization: cords fully visualized  ETT: 7.5 ETT passed on first attempt and secured @ 21 cm at upper incisors  Findings: Anterior airway   Evaluation:  Tube position confirmed by auscultation and EZCap CXR reveals tip of tube in mid trachea  Pt tolerated procedure well without complications    Merton Border, MD PCCM service Mobile [phone] Pager [phone] 06/18/2016

## 2016-06-18 NOTE — NC FL2 (Signed)
[place] MEDICAID FL2 LEVEL OF CARE SCREENING TOOL     IDENTIFICATION  Patient Name: Laurie Morton Birthdate: 1941-01-20 Sex: female Admission Date (Current Location): 06/10/2016  [place]   Facility and Address:  [facility], [address], [place], [place] [address]      Provider Number: [id number]  Attending Physician Name and Address:  Houston SirenVivek J Sainani, MD  Relative Name and Phone Number:       Current Level of Care: Hospital Recommended Level of Care: Skilled Nursing Facility Prior Approval Number:    Date Approved/Denied:   PASRR Number:    Discharge Plan: SNF    Current Diagnoses: Patient Active Problem List   Diagnosis Date Noted  . DKA (diabetic ketoacidoses) (HCC) 06/17/2016  . Dementia 06/17/2016  . AKI (acute kidney injury) (HCC) 06/17/2016  . Chronic renal disease 03/27/2015  . 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 Cerebrovascular disease (mild chronic small vessel) 03/27/2015  . Psychosis in elderly with behavioral disturbance   . B12 deficiency 03/18/2015  . Diabetes (HCC) 03/14/2015  . Essential hypertension 03/14/2015  . Dyslipidemia 03/14/2015  . Hearing loss 03/14/2015  . GERD (gastroesophageal reflux disease) 03/14/2015  . Vitamin D deficiency 03/14/2015    Orientation RESPIRATION BLADDER Height & Weight     Self  Normal Continent Weight: 117 lb 6.4 oz (53.3 kg) Height:  5\' 4"  (162.6 cm)  BEHAVIORAL SYMPTOMS/MOOD NEUROLOGICAL BOWEL NUTRITION STATUS      Continent Diet (Carb Modified)  AMBULATORY STATUS COMMUNICATION OF NEEDS Skin   Limited Assist Verbally Normal                       Personal Care Assistance Level of Assistance  Bathing, Feeding, Dressing Bathing Assistance: Limited assistance Feeding assistance: Independent Dressing  Assistance: Limited assistance     Functional Limitations Info  Sight, Hearing, Speech Sight Info: Adequate Hearing Info: Adequate Speech Info: Adequate    SPECIAL CARE FACTORS FREQUENCY  PT (By licensed PT)     PT Frequency: 5              Contractures Contractures Info: Not present    Additional Factors Info  Code Status, Allergies, Psychotropic, Insulin Sliding Scale Code Status Info: Full Code Allergies Info: Ambien Zolpidem Tartrate Psychotropic Info: Medication:  Risperdal, Lexapro Insulin Sliding Scale Info: 4xday       Current Medications (06/18/2016):  This is the current hospital active medication list Current Facility-Administered Medications  Medication Dose Route Frequency Provider Last Rate Last Dose  . atorvastatin (LIPITOR) tablet 20 mg  20 mg Oral Daily Oralia Manisavid Willis, MD   20 mg at 06/18/16 0825  . escitalopram (LEXAPRO) tablet 10 mg  10 mg Oral Daily Oralia Manisavid Willis, MD   10 mg at 06/18/16 0825  . heparin injection 5,000 Units  5,000 Units Subcutaneous Q8H Oralia Manisavid Willis, MD   5,000 Units at 06/18/16 [phone]  . insulin aspart (novoLOG) injection 0-5 Units  0-5 Units Subcutaneous QHS Houston SirenVivek J Sainani, MD   5 Units at 06/17/16 2110  . insulin aspart (novoLOG) injection 0-9 Units  0-9 Units Subcutaneous TID WC Houston SirenVivek J Sainani, MD   7 Units at 06/18/16 0825  . insulin glargine (LANTUS) injection 10 Units  10 Units Subcutaneous Daily Houston SirenVivek J Sainani, MD   10 Units at 06/18/16 0825  . lisinopril (PRINIVIL,ZESTRIL) tablet 20 mg  20 mg Oral Daily Oralia Manis, MD   20 mg at 06/18/16 0825  . risperiDONE (RISPERDAL) tablet 1 mg  1 mg Oral QHS Oralia Manis, MD   1 mg at 06/17/16 2111     Discharge Medications: Please see discharge summary for a list of discharge medications.  Relevant Imaging Results:  Relevant Lab Results:   Additional Information SSN:  [id number]  Dede Query, LCSW

## 2016-06-18 NOTE — Progress Notes (Signed)
Pt. Was transported to CT from CCU while on the vent.

## 2016-06-18 NOTE — H&P (Deleted)
PULMONARY / CRITICAL CARE MEDICINE   Name: Laurie Morton MRN: [id number] DOB: [birth year]    ADMISSION DATE:  06/03/2016 CONSULTATION DATE:  06/18/2016  REFERRING MD:  Dr. Verdell Carmine  CHIEF COMPLAINT:  Altered Mental Status and Hyperglycemia  HISTORY OF PRESENT ILLNESS:   Laurie Morton is a 76 y.o. Female with a PMH of Schizophrenia, HTN, DM, Dementia.  She presented to [facility] on 05/23/2016  with c/o Altered Mental Status after being found on the bathroom floor by her family.  She was admitted to [place] unit for treatment of DKA and Altered Mental Status.  She was transferred out to Med/surg unit on 3/27.  On 3/28, she was found  unresponsive, hypotension, and bradypneic on Med/surg unit.   Therefore she was transferred to ICU for emergent intubation.  She was given IV Narcan with no improvement of unresponsiveness.  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  PCCM is consulted for further management of Acute Encephalopathy and Acute Respiratory Failure requiring mechanical ventilation.  PAST MEDICAL HISTORY :  She  has a past medical history of Dementia; Diabetes mellitus without complication ([place]); Hypertension; and Schizophrenia ([place]).  PAST SURGICAL HISTORY: She  has a past surgical history that includes Abdominal hysterectomy and Tonsillectomy.  Allergies  Allergen Reactions  . Ambien [Zolpidem Tartrate] Other (See Comments)    hallucinations    No current facility-administered medications on file prior to encounter.    Current Outpatient Prescriptions on File Prior to Encounter  Medication Sig  . albuterol (PROVENTIL HFA;VENTOLIN HFA) 108 (90 BASE) MCG/ACT inhaler Inhale 2 puffs into the lungs every 6 (six) hours as needed for wheezing or shortness of breath.  Marland Kitchen atorvastatin (LIPITOR) 20 MG tablet Take 20 mg by mouth daily.  . ergocalciferol (VITAMIN D2) 50000 UNITS capsule Take 50,000 Units by mouth once a week.  .  fenofibrate (TRICOR) 48 MG tablet Take 48 mg by mouth daily.  Marland Kitchen gemfibrozil (LOPID) 600 MG tablet Take 600 mg by mouth daily.   . citalopram (CELEXA) 10 MG tablet Take 1 tablet (10 mg total) by mouth daily. (Patient not taking: Reported on 05/31/2016)  . cyanocobalamin 1000 MCG tablet Take 1 tablet (1,000 mcg total) by mouth daily. (Patient not taking: Reported on 06/15/2016)  . glimepiride (AMARYL) 2 MG tablet Take 1 tablet (2 mg total) by mouth daily with breakfast. (Patient not taking: Reported on 06/14/2016)    FAMILY HISTORY:  Her indicated that the status of her father is unknown. She indicated that the status of her brother is unknown.    SOCIAL HISTORY: She  reports that she has never smoked. She has never used smokeless tobacco. She reports that she does not drink alcohol or use drugs.  REVIEW OF SYSTEMS:   Unable to assess due to unresponsiveness  SUBJECTIVE:  Unable to assess due to unresponsiveness  VITAL SIGNS: BP (!) 157/47   Pulse (!) 56   Temp 98.5 F (36.9 C)   Resp 16   Ht _0  (1.626 m)   Wt 117 lb 6.4 oz (53.3 kg)   SpO2 100%   BMI 20.15 kg/m   HEMODYNAMICS:    VENTILATOR SETTINGS: Vent Mode: PRVC FiO2 (%):  [40 %] 40 % Set Rate:  [16 bmp] 16 bmp Vt Set:  [450 mL] 450 mL PEEP:  [5 cmH20] 5 cmH20  INTAKE / OUTPUT: I/O last 3 completed shifts: In: 1203 [I.V.:938; IV Piggyback:265] Out: -  PHYSICAL EXAMINATION: General:  Acutely ill-appearing, caucasian female, intubated, lying in bed Neuro:  Unresponsive, Pupils Pinpoint & Nonreactive HEENT:  Atraumatic, Normocephalic, Neck supple, No JVD Cardiovascular:  RRR, s1s2 noted, No M/R/G Lungs:  Diminished throughout, symmetrical expansion, no accessory muscle use Abdomen:  BS + x4, Soft, Non-tender, Non-distended Musculoskeletal:  Deep Tendon Reflexes absent, No edema Skin:  Dry, intact, No rashes, lesions, or ulcerations.  LABS:  BMET  Recent Labs Lab 06/17/16 0803 06/17/16 1127  06/18/16 0505  NA 152* 148* 140  K 3.4* 4.2  4.3 4.1  CL 114* 112* 110  CO2 _0 BUN 86* 80* 59*  CREATININE 1.34* 1.05* 1.12*  GLUCOSE 83 136* 438*    Electrolytes  Recent Labs Lab 06/17/16 0803 06/17/16 1127 06/18/16 0505  CALCIUM 9.9 9.7 9.1  MG  --  2.1  --     CBC  Recent Labs Lab 05/28/2016 2206 06/17/16 0417 06/18/16 0505  WBC 15.8* 12.2* 7.8  HGB 14.8 13.9 10.9*  HCT 45.1 40.1 31.0*  PLT 213 192 138*    Coag's No results for input(s): APTT, INR in the last 168 hours.  Sepsis Markers No results for input(s): LATICACIDVEN, PROCALCITON, O2SATVEN in the last 168 hours.  ABG  Recent Labs Lab 06/17/16 0007 06/18/16 1530  PHART 7.43 7.53*  PCO2ART 36 30*  PO2ART 95 156*    Liver Enzymes  Recent Labs Lab 06/05/2016 2206  AST 18  ALT 13*  ALKPHOS 97  BILITOT 1.1  ALBUMIN 4.4    Cardiac Enzymes  Recent Labs Lab 05/31/2016 2206  TROPONINI 0.04*    Glucose  Recent Labs Lab 06/17/16 1655 06/17/16 2035 06/18/16 0752 06/18/16 1144 06/18/16 1224 06/18/16 1241  GLUCAP 197* 355* 338* 280* 288* 266*    Imaging Dg Chest Port 1 View  Result Date: 06/18/2016 CLINICAL DATA:  Central line and endotracheal tube placement. EXAM: PORTABLE CHEST 1 VIEW COMPARISON:  06/13/2016. FINDINGS: Endotracheal tube terminates 2.7 cm above the carina. Right IJ central line tip projects over the low SVC. No pneumothorax. Heart size normal. Calcified mediastinal and hilar lymph nodes. Lungs are somewhat low in volume. Probable granuloma in the right midlung zone. Lungs are otherwise clear. No pleural fluid. IMPRESSION: 1. Satisfactory endotracheal tube and right IJ central line placement. No pneumothorax. 2. No acute findings in the chest. Electronically Signed   By: Lorin Picket M.D.   On: 06/18/2016 15:04     STUDIES:  CT Head 3/28>> Areas of low-density now throughout the cerebellum, affecting the pons and affecting the right occipital lobe consistent  with acute infarctions. This either represents posterior circulation embolic disease or thrombotic disease. No hemorrhage.  CULTURES: None  ANTIBIOTICS: None  SIGNIFICANT EVENTS: 3/26>> Admission to [facility] Stepdown unit for DKA 3/27>> Transfer out to Med/surg unit 3/28>> Rapid response called for unresponsiveness, hypotension, and bradypnea.  Transfer to ICU, emergently intubated.  CT Head shows acute ischemic infarctions involving the pons and right occipital lobe.  LINES/TUBES: 3/28>> Right IJ TLC 3/38>> 7.5 ETT  DISCUSSION: Ms. Beaubien is a 76 y.o. Female admitted to [facility] stepdown unit for DKA and AMS.  She was transferred to Med/surg unit on 3/27.  On 3/28 she was found unresponsive, hypotensive, and bradypnea, therefore was transferred to ICU and emergently intubated.  CT Head on 3/28 is concerning for acute ischemic CVA involving the pons and right occipital lobe.  ASSESSMENT / PLAN:  PULMONARY A: Acute Respiratory Failure secondary Acute Ischemic CVA  P:  Full Vent Support Vent Bundle Maintain O2 sats >92% SBT when parameters met   CARDIOVASCULAR A:  Hypotension>>Resolved Hx: HTN  P:  Continuous Telemetry Monitoring Keep SBP <150 & DBP<100, prn Hydralazine if needed to maintain BP goal Obtain Echocardiogram 3/28 Obtain Carotid Ultrasound 3/28 Trend Troponin's NS @ 50 ml/hr  RENAL A:   Acute Renal Failure  P:   Monitor I&O's Follow BMP's Replace electrolytes as indicated per protocol  GASTROINTESTINAL A:   No Acute Issues  P:   Keep NPO for now Pepcid for SUP  HEMATOLOGIC A:   Anemia  P:  Monitor for S/Sx of bleeding Follow CBC's Transfuse for Hgb <7 per protocol Heparin SQ for VTE prophylaxis  INFECTIOUS A:   No acute issues  P:   Monitor Fever curve Maintain Normothermia, prn tylenol if needed Follow WBC's  ENDOCRINE A:   Hyperglycemia Hx: DM    P:   CBG q4h Continue SSI q4h Follow Hypo/Hyperglycemia  protocol  NEUROLOGIC A:   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 PRES Hx: Dementia  P:   RASS goal: 0 to -1 Propofol gtt & Fentanyl pushes to maintain RASS goal Daily WUA Continue ASA & Lipitor Neurology Consulted, appreciate input May need MRI   FAMILY  - Updates: No family currently at bedside  - Inter-disciplinary family meet or Palliative Care meeting due by:  06/25/16   06/18/2016, 4:20 PM  Merton Border, MD PCCM service Mobile [phone] Pager [phone] 06/18/2016

## 2016-06-19 ENCOUNTER — Inpatient Hospital Stay: Payer: Medicare HMO

## 2016-06-19 DIAGNOSIS — E118 Type 2 diabetes mellitus with unspecified complications: Secondary | ICD-10-CM

## 2016-06-19 DIAGNOSIS — R4189 Other symptoms and signs involving cognitive functions and awareness: Secondary | ICD-10-CM

## 2016-06-19 DIAGNOSIS — Z9911 Dependence on respirator [ventilator] status: Secondary | ICD-10-CM

## 2016-06-19 DIAGNOSIS — I639 Cerebral infarction, unspecified: Secondary | ICD-10-CM

## 2016-06-19 LAB — CBC WITH DIFFERENTIAL/PLATELET
Basophils Absolute: 0 10*3/uL (ref 0–0.1)
Basophils Relative: 0 %
Eosinophils Absolute: 0 10*3/uL (ref 0–0.7)
Eosinophils Relative: 0 %
HCT: 22.9 % — ABNORMAL LOW (ref 35.0–47.0)
HEMOGLOBIN: 7.9 g/dL — AB (ref 12.0–16.0)
LYMPHS ABS: 0.7 10*3/uL — AB (ref 1.0–3.6)
LYMPHS PCT: 12 %
MCH: 31.5 pg (ref 26.0–34.0)
MCHC: 34.5 g/dL (ref 32.0–36.0)
MCV: 91.3 fL (ref 80.0–100.0)
MONOS PCT: 10 %
Monocytes Absolute: 0.7 10*3/uL (ref 0.2–0.9)
NEUTROS PCT: 78 %
Neutro Abs: 4.9 10*3/uL (ref 1.4–6.5)
Platelets: 118 10*3/uL — ABNORMAL LOW (ref 150–440)
RBC: 2.5 MIL/uL — AB (ref 3.80–5.20)
RDW: 13.5 % (ref 11.5–14.5)
WBC: 6.3 10*3/uL (ref 3.6–11.0)

## 2016-06-19 LAB — CBC
HCT: 28.9 % — ABNORMAL LOW (ref 35.0–47.0)
HEMOGLOBIN: 10.3 g/dL — AB (ref 12.0–16.0)
MCH: 31.8 pg (ref 26.0–34.0)
MCHC: 35.6 g/dL (ref 32.0–36.0)
MCV: 89.1 fL (ref 80.0–100.0)
Platelets: 121 10*3/uL — ABNORMAL LOW (ref 150–440)
RBC: 3.24 MIL/uL — AB (ref 3.80–5.20)
RDW: 13.8 % (ref 11.5–14.5)
WBC: 7.5 10*3/uL (ref 3.6–11.0)

## 2016-06-19 LAB — COMPREHENSIVE METABOLIC PANEL
ALK PHOS: 65 U/L (ref 38–126)
ALT: 10 U/L — AB (ref 14–54)
AST: 29 U/L (ref 15–41)
Albumin: 3 g/dL — ABNORMAL LOW (ref 3.5–5.0)
Anion gap: 6 (ref 5–15)
BUN: 42 mg/dL — ABNORMAL HIGH (ref 6–20)
CALCIUM: 9.2 mg/dL (ref 8.9–10.3)
CO2: 25 mmol/L (ref 22–32)
CREATININE: 1.02 mg/dL — AB (ref 0.44–1.00)
Chloride: 116 mmol/L — ABNORMAL HIGH (ref 101–111)
GFR calc non Af Amer: 53 mL/min — ABNORMAL LOW (ref >60)
GLUCOSE: 169 mg/dL — AB (ref 65–99)
Potassium: 3.6 mmol/L (ref 3.5–5.1)
SODIUM: 147 mmol/L — AB (ref 135–145)
Total Bilirubin: 0.5 mg/dL (ref 0.3–1.2)
Total Protein: 5.9 g/dL — ABNORMAL LOW (ref 6.5–8.1)

## 2016-06-19 LAB — ECHOCARDIOGRAM COMPLETE
Height: 64 in
WEIGHTICAEL: 1864 [oz_av]

## 2016-06-19 LAB — GLUCOSE, CAPILLARY
GLUCOSE-CAPILLARY: 149 mg/dL — AB (ref 65–99)
GLUCOSE-CAPILLARY: 264 mg/dL — AB (ref 65–99)
Glucose-Capillary: 125 mg/dL — ABNORMAL HIGH (ref 65–99)
Glucose-Capillary: 138 mg/dL — ABNORMAL HIGH (ref 65–99)
Glucose-Capillary: 195 mg/dL — ABNORMAL HIGH (ref 65–99)
Glucose-Capillary: 192 mg/dL — ABNORMAL HIGH (ref 65–99)
Glucose-Capillary: 298 mg/dL — ABNORMAL HIGH (ref 65–99)

## 2016-06-19 LAB — PROTIME-INR
INR: 1.11
Prothrombin Time: 14.3 seconds (ref 11.4–15.2)

## 2016-06-19 LAB — AST: AST: 34 U/L (ref 15–41)

## 2016-06-19 LAB — APTT: aPTT: 72 seconds — ABNORMAL HIGH (ref 24–36)

## 2016-06-19 LAB — ALT: ALT: 11 U/L — AB (ref 14–54)

## 2016-06-19 MED ORDER — VITAL AF 1.2 CAL PO LIQD
1000.0000 mL | ORAL | Status: DC
  Administered 2016-06-19: 1000 mL

## 2016-06-19 MED ORDER — SODIUM CHLORIDE 0.9 % IV SOLN
5.0000 mg/h | INTRAVENOUS | Status: DC
  Administered 2016-06-19: 10 mg/h via INTRAVENOUS
  Filled 2016-06-19: qty 10

## 2016-06-19 MED ORDER — HYDRALAZINE HCL 20 MG/ML IJ SOLN
10.0000 mg | INTRAMUSCULAR | Status: DC | PRN
  Administered 2016-06-19: 10 mg via INTRAVENOUS
  Filled 2016-06-19: qty 1

## 2016-06-19 MED ORDER — VITAL HIGH PROTEIN PO LIQD
1000.0000 mL | ORAL | Status: DC
  Administered 2016-06-19: 16:00:00
  Administered 2016-06-19: 1000 mL

## 2016-06-19 MED ORDER — MORPHINE 100MG IN NS 100ML (1MG/ML) PREMIX INFUSION: 5.0000 mg/h | INTRAVENOUS | Status: DC

## 2016-06-19 MED ORDER — PRO-STAT SUGAR FREE PO LIQD
30.0000 mL | Freq: Two times a day (BID) | ORAL | Status: DC
  Administered 2016-06-19: 30 mL

## 2016-06-19 MED ORDER — GADOBENATE DIMEGLUMINE 529 MG/ML IV SOLN
10.0000 mL | Freq: Once | INTRAVENOUS | Status: AC | PRN
  Administered 2016-06-19: 10 mL via INTRAVENOUS

## 2016-06-19 MED ORDER — ASPIRIN 81 MG PO CHEW
81.0000 mg | CHEWABLE_TABLET | Freq: Every day | ORAL | Status: DC
  Administered 2016-06-19: 81 mg
  Filled 2016-06-19: qty 1

## 2016-06-19 MED ORDER — FAMOTIDINE 20 MG PO TABS
20.0000 mg | ORAL_TABLET | Freq: Every day | ORAL | Status: DC
  Administered 2016-06-19: 20 mg
  Filled 2016-06-19: qty 1

## 2016-06-19 MED ORDER — SODIUM CHLORIDE 0.9 % IV SOLN: 5.0000 mg/h | INTRAVENOUS | Status: DC

## 2016-06-19 MED ORDER — INSULIN GLARGINE 100 UNIT/ML ~~LOC~~ SOLN
20.0000 [IU] | Freq: Every day | SUBCUTANEOUS | Status: DC
  Administered 2016-06-19: 20 [IU] via SUBCUTANEOUS
  Filled 2016-06-19 (×2): qty 0.2

## 2016-06-19 MED ORDER — PANTOPRAZOLE SODIUM 40 MG IV SOLR
40.0000 mg | Freq: Two times a day (BID) | INTRAVENOUS | Status: DC
Start: 2016-06-19 — End: 2016-06-19

## 2016-06-22 NOTE — Progress Notes (Signed)
Patient still withdraw to pain with no eye opening. Patient have non-purposeful movements in lower extremities. OG was successfully placed. Patient had poor urine output during this nurse shift. Had to give hydralazine once at 0507 for hypertension. Will continue to monitor patient.

## 2016-06-22 NOTE — Progress Notes (Signed)
Initial Nutrition Assessment  DOCUMENTATION CODES:   Not applicable  INTERVENTION:  - Initiate TF of Vital AF 1.2 at 40 mL/hr (960 mL per day) - Providing 1152 calories, 72 grams protein, and 778 mL free water  NUTRITION DIAGNOSIS:   Inadequate oral intake related to inability to eat as evidenced by NPO status.  GOAL:   Patient will meet greater than or equal to 90% of their needs  MONITOR:   Vent status, Labs, Weight trends, I & O's, Diet advancement, TF tolerance  REASON FOR ASSESSMENT:   Consult Enteral/tube feeding initiation and management  ASSESSMENT:   76 y.o. Female with PMH of dementia, DM, HTN, and schizophrenia presents with AMS and DKA. Pt's family found her on her bathroom floor. Pt was intubated on 3/28 after being found unresponsive, hypotensive, and bradypneic on Med/surg unit.    Pt is currently intubated on ventilator support MV: 7.2 L/min Propofol was discontinued at time of visit Temp (24hrs), Avg:98.4 F (36.9 C), Min:97.8 F (36.6 C), Max:98.8 F (37.1 C)  No historians at bedside  Pt has no recent weight history documented in chart  Labs reviewed; CBG (125-288), Na (147) Medications reviewed; Pepcid, Sliding scale insulin, Lantus, Propofol  Nutrition-focused physical exam completed. Findings include no fat depletion, moderate muscle depletion in the anterior thigh region only could be attributed to age related sarcopenia, and no edema   Diet Order:     Skin:  Reviewed, no issues  Last BM:  PTA  Height:   Ht Readings from Last 1 Encounters:  06/18/16 5\' 4"  (1.626 m)    Weight:   Wt Readings from Last 1 Encounters:  06/18/16 116 lb 8 oz (52.8 kg)    Ideal Body Weight:  54.5 kg  BMI:  Body mass index is 20 kg/m.  Estimated Nutritional Needs:   Kcal:  1184  Protein:  65-80 grams  Fluid:  >/= 1.5 L/d  EDUCATION NEEDS:   Education needs no appropriate at this time  [employer] Dietetic Intern

## 2016-06-22 NOTE — Progress Notes (Signed)
Pt. Was transported on the trilogy vent and placed on the MRI compatible vent for a MRI. She was transported back to CCU.

## 2016-06-22 NOTE — Progress Notes (Signed)
I have discussed prognosis with Son Laurie Morton over the phone, he was not able to come to ICU. I have explained MRI findings and patient has extensive brain injury. Laurie Morton has said to me  That patient has been progressively declining over last several years with dementia and her living will states no life support. He has consented and agreed to DNR status and he is aware that the family will need to make major decisions in the days to come.  Will place DNR orders.

## 2016-06-22 NOTE — Progress Notes (Signed)
This nurse came on shift and was given report by day shift that patient was bleeding from mouth and nose briefly before this nurse took over. Day shift suctioned 150 cc out during her shift. This nurse suctioned 50cc during this shift. OG was put on low intermittent suction by day nurse and had blood being pulled from stomach. At this point, patient was still only withdrawing to pain with no sedation nor medication given. Eyes were sluggish and size 2. Patient was declining and NP [place] was notified. NP called and notified patient's son who was next of kin. NP talked to family and family decided to make patient comfort care. Morphine drip was started to keep patient comfortable.  At 8:51 pm, respiratory extubated patient to room air. At 8:53 pm, patient deceased and family was present during passing. [facility] was notified of patient's death. Patient was released from [facility].

## 2016-06-22 NOTE — Consult Note (Signed)
Referring Physician: Alva Garnet    Chief Complaint: Laurie Morton  HPI: Laurie Morton is an 76 y.o. female presenting in DKA.  Was ready for discharge on yesterday.  Seen at baseline by nursing in the morning.  When they returned later in the day to administer medications patient was unresponsive.  Patient required intubation and was transferred to the ICU.  Consult called fir further recommendations: Patient at baseline with a dementia.  Is only oriented to self.    Date last known well: Date: 06/18/2016 Time last known well: Time: 09:00 tPA Given: No: Outside time window  Past Medical History:  Diagnosis Date  . Dementia   . Diabetes mellitus without complication ([place])   . Hypertension   . Schizophrenia [facility])     Past Surgical History:  Procedure Laterality Date  . ABDOMINAL HYSTERECTOMY    . TONSILLECTOMY      Family History  Problem Relation Age of Onset  . CAD Father   . Hypertension Father   . CAD Brother    Social History:  reports that she has never smoked. She has never used smokeless tobacco. She reports that she does not drink alcohol or use drugs.  Allergies:  Allergies  Allergen Reactions  . Ambien [Zolpidem Tartrate] Other (See Comments)    hallucinations    Medications:  I have reviewed the patient's current medications. Prior to Admission:  Prescriptions Prior to Admission  Medication Sig Dispense Refill Last Dose  . albuterol (PROVENTIL HFA;VENTOLIN HFA) 108 (90 BASE) MCG/ACT inhaler Inhale 2 puffs into the lungs every 6 (six) hours as needed for wheezing or shortness of breath.   prn  . atorvastatin (LIPITOR) 20 MG tablet Take 20 mg by mouth daily.   06/15/2016 at Unknown time  . ergocalciferol (VITAMIN D2) 50000 UNITS capsule Take 50,000 Units by mouth once a week.   Past Week at Unknown time  . escitalopram (LEXAPRO) 10 MG tablet Take 10 mg by mouth daily.     . fenofibrate (TRICOR) 48 MG tablet Take 48 mg by mouth daily.   06/15/2016 at Unknown time   . gemfibrozil (LOPID) 600 MG tablet Take 600 mg by mouth daily.    05/22/2016 at Unknown time  . lisinopril (PRINIVIL,ZESTRIL) 20 MG tablet Take 20 mg by mouth daily.     . magnesium oxide (MAG-OX) 400 MG tablet Take 400 mg by mouth daily.   05/26/2016 at Unknown time  . metFORMIN (GLUCOPHAGE) 500 MG tablet Take 1,000 mg by mouth 2 (two) times daily with a meal.     . risperiDONE (RISPERDAL) 1 MG tablet Take 1 mg by mouth at bedtime.   06/15/2016 at Unknown time  . citalopram (CELEXA) 10 MG tablet Take 1 tablet (10 mg total) by mouth daily. (Patient not taking: Reported on 06/13/2016) 30 tablet 0 Not Taking at Unknown time  . cyanocobalamin 1000 MCG tablet Take 1 tablet (1,000 mcg total) by mouth daily. (Patient not taking: Reported on 06/11/2016) 30 tablet 0 Not Taking at Unknown time  . glimepiride (AMARYL) 2 MG tablet Take 1 tablet (2 mg total) by mouth daily with breakfast. (Patient not taking: Reported on 06/06/2016) 30 tablet 0 Not Taking at Unknown time   Scheduled: . aspirin  81 mg Per Tube Daily  . atorvastatin  20 mg Per Tube Daily  . chlorhexidine gluconate (MEDLINE KIT)  15 mL Mouth Rinse BID  . famotidine  20 mg Per Tube Daily  . feeding supplement (PRO-STAT SUGAR FREE 64)  30  mL Per Tube BID  . feeding supplement (VITAL HIGH PROTEIN)  1,000 mL Per Tube Q24H  . heparin  5,000 Units Subcutaneous Q8H  . insulin aspart  0-15 Units Subcutaneous Q4H  . insulin glargine  20 Units Subcutaneous Daily  . mouth rinse  15 mL Mouth Rinse QID    ROS: Unable to provide  Physical Examination: Blood pressure (!) 141/68, pulse 91, temperature 98.5 F (36.9 C), temperature source Oral, resp. rate 16, height 5' 4" (1.626 m), weight 52.8 kg (116 lb 8 oz), SpO2 100 %.  HEENT-  Normocephalic, no lesions, without obvious abnormality.  Normal external eye and conjunctiva.  Normal TM's bilaterally.  Normal auditory canals and external ears. Normal external nose, mucus membranes and septum.  Normal  pharynx. Cardiovascular- S1, S2 normal, pulses palpable throughout   Lungs- chest clear, no wheezing, rales, normal symmetric air entry Abdomen- soft, non-tender; bowel sounds normal; no masses,  no organomegaly Extremities- no edema Lymph-no adenopathy palpable Musculoskeletal-no joint tenderness, deformity or swelling Skin-warm and dry, no hyperpigmentation, vitiligo, or suspicious lesions  Neurological Examination   Mental Status: Patient does not respond to verbal stimuli.  With deep sternal rub localizes with the LUE.  Does not follow commands.  No verbalizations noted.  Cranial Nerves: II: patient does not respond confrontation bilaterally, pupils right 2 mm, left 2 mm,and reactive bilaterally III,IV,VI: doll's response present bilaterally.  V,VII: corneal reflex present bilaterally  VIII: patient does not respond to verbal stimuli IX,X: gag reflex reduced, XI: trapezius strength unable to test bilaterally XII: tongue strength unable to test Motor: Extremities flaccid throughout.  Some withdrawal movement noted in the lower extremities Sensory: Responds to noxious stimuli in the lower extremities. Deep Tendon Reflexes:  2+ throughout with absent AJ's bilaterally Plantars: upgoing bilaterally Cerebellar: Unable to perform        Laboratory Studies:  Basic Metabolic Panel:  Recent Labs Lab 06/17/16 0417 06/17/16 0803 06/17/16 1127 06/18/16 0505 Jun 27, 2016 0508  NA 148* 152* 148* 140 147*  K 2.9* 3.4* 4.2  4.3 4.1 3.6  CL 110 114* 112* 110 116*  CO2 _0 GLUCOSE 376* 83 136* 438* 169*  BUN 90* 86* 80* 59* 42*  CREATININE 1.42* 1.34* 1.05* 1.12* 1.02*  CALCIUM 10.1 9.9 9.7 9.1 9.2  MG  --   --  2.1  --   --     Liver Function Tests:  Recent Labs Lab 06/08/2016 2206 06-27-2016 0508  AST 18 29  ALT 13* 10*  ALKPHOS 97 65  BILITOT 1.1 0.5  PROT 8.2* 5.9*  ALBUMIN 4.4 3.0*   No results for input(s): LIPASE, AMYLASE in the last 168 hours. No  results for input(s): AMMONIA in the last 168 hours.  CBC:  Recent Labs Lab 06/05/2016 2206 06/17/16 0417 06/18/16 0505 27-Jun-2016 0508  WBC 15.8* 12.2* 7.8 7.5  HGB 14.8 13.9 10.9* 10.3*  HCT 45.1 40.1 31.0* 28.9*  MCV 93.2 89.5 91.0 89.1  PLT 213 192 138* 121*    Cardiac Enzymes:  Recent Labs Lab 06/02/2016 2206 06/18/16 1900  TROPONINI 0.04* 0.05*    BNP: Invalid input(s): POCBNP  CBG:  Recent Labs Lab 06/18/16 1617 06/18/16 1959 06/18/16 2352 2016-06-27 0403 06-27-2016 0714  GLUCAP 269* 138* 125* 149* 195*    Microbiology: Results for orders placed or performed during the hospital encounter of 06/09/2016  Urine culture     Status: None   Collection Time: 06/06/2016 10:49 PM  Result Value Ref Range  Status   Specimen Description URINE, CATHETERIZED  Final   Special Requests Normal  Final   Culture   Final    NO GROWTH Performed at [facility], [address], [place], [place] [address]    Report Status 06/18/2016 FINAL  Final  MRSA PCR Screening     Status: None   Collection Time: 06/17/16  3:03 AM  Result Value Ref Range Status   MRSA by PCR NEGATIVE NEGATIVE Final    Comment:        The GeneXpert MRSA Assay (FDA approved for NASAL specimens only), is one component of a comprehensive MRSA colonization surveillance program. It is not intended to diagnose MRSA infection nor to guide or monitor treatment for MRSA infections.     Coagulation Studies: No results for input(s): LABPROT, INR in the last 72 hours.  Urinalysis:  Recent Labs Lab 06/15/2016 2249  COLORURINE YELLOW*  LABSPEC 1.025  PHURINE 5.0  GLUCOSEU >=500*  HGBUR NEGATIVE  BILIRUBINUR NEGATIVE  KETONESUR 5*  PROTEINUR 100*  NITRITE NEGATIVE  LEUKOCYTESUR NEGATIVE    Lipid Panel:    Component Value Date/Time   CHOL 242 (H) 03/14/2015 1142   TRIG 583 (H) 06/18/2016 1900   HDL 44 03/14/2015 1142   CHOLHDL 5.5 03/14/2015 1142   VLDL 73 (H) 03/14/2015 1142   LDLCALC 125 (H)  03/14/2015 1142    HgbA1C:  Lab Results  Component Value Date   HGBA1C 7.4 (H) 03/14/2015    Urine Drug Screen:     Component Value Date/Time   LABOPIA NONE DETECTED 03/12/2015 1148   COCAINSCRNUR NONE DETECTED 03/12/2015 1148   LABBENZ NONE DETECTED 03/12/2015 1148   AMPHETMU NONE DETECTED 03/12/2015 1148   THCU NONE DETECTED 03/12/2015 1148   LABBARB NONE DETECTED 03/12/2015 1148    Alcohol Level: No results for input(s): ETH in the last 168 hours.  Other results: EKG: sinus rhythm at 96 bpm.  Imaging: Ct Angio Head W Or Wo Contrast  Result Date: 06/18/2016 CLINICAL DATA:  Unresponsive, acute diabetic ketoacidosis. History of hypertension, diabetes, schizophrenia and dementia. EXAM: CT ANGIOGRAPHY HEAD AND NECK TECHNIQUE: Multidetector CT imaging of the head and neck was performed using the standard protocol during bolus administration of intravenous contrast. Multiplanar CT image reconstructions and MIPs were obtained to evaluate the vascular anatomy. Carotid stenosis measurements (when applicable) are obtained utilizing NASCET criteria, using the distal internal carotid diameter as the denominator. CONTRAST:  75 cc Isovue 370 COMPARISON:  CT HEAD June 18, 2016 at 1544 hours and CT HEAD June 16, 2016 FINDINGS: CTA NECK AORTIC ARCH: Normal appearance of the thoracic arch, normal branch pattern. The origins of the innominate, left Common carotid artery and subclavian artery are patent, moderate intimal thickening calcific atherosclerosis. Mild stenosis distal RIGHT subclavian artery due to calcific atherosclerosis. RIGHT CAROTID SYSTEM: Common carotid artery is widely patent, coursing in a straight line fashion. Normal appearance of the carotid bifurcation without hemodynamically significant stenosis by NASCET criteria, mild eccentric atherosclerosis. Normal appearance of the included internal carotid artery. LEFT CAROTID SYSTEM: Common carotid artery is widely patent, coursing in a  straight line fashion. Normal appearance of the carotid bifurcation without hemodynamically significant stenosis by NASCET criteria, mild eccentric atherosclerosis. Normal appearance of the included internal carotid artery. VERTEBRAL ARTERIES:Left vertebral artery is dominant. Normal appearance of the vertebral arteries, which appear widely patent. SKELETON: No acute osseous process though bone windows have not been submitted. Moderate to severe C5-6 and C6-7 degenerative disc. OTHER NECK:  Soft tissues of the neck are non-acute though, not tailored for evaluation. Endotracheal tube tip 1-2 cm above the carina. Fluid distended esophagus, incompletely characterized. RIGHT internal jugular central venous catheter, distal tip not included. Small calcified mediastinal lymph nodes. CTA HEAD ANTERIOR CIRCULATION: Patent cervical internal carotid arteries, petrous, cavernous and supra clinoid internal carotid arteries. Moderate stenosis RIGHT supraclinoid internal carotid artery. LEFT A1 segment is dominant. Widely patent anterior communicating artery. Normal appearance of the anterior and middle cerebral arteries. No large vessel occlusion, hemodynamically significant stenosis, dissection, luminal irregularity, contrast extravasation or aneurysm. POSTERIOR CIRCULATION: Widely patent vertebral arteries, vertebrobasilar junction and basilar artery, as well as main branch vessels. Mild atherosclerosis bilateral V4 segments. Bilateral posterior cerebral artery is patent. Tandem mild stenoses RIGHT P1 segment. No large vessel occlusion, hemodynamically significant stenosis, dissection, contrast extravasation or aneurysm. VENOUS SINUSES: Major dural venous sinuses are patent though not tailored for evaluation on this angiographic examination. ANATOMIC VARIANTS: None. DELAYED PHASE: Not performed. MIP images reviewed. IMPRESSION: CTA NECK: No acute vascular process or hemodynamically significant stenosis. Fluid distended  esophagus concerning for achalasia presents aspiration risk. CTA HEAD:  No emergent large vessel occlusion or severe stenosis. Moderate stenosis RIGHT supraclinoid internal carotid artery. Mild intracranial atherosclerosis. Electronically Signed   By: Elon Alas M.D.   On: 06/18/2016 17:56   Ct Head Wo Contrast  Result Date: 06/18/2016 CLINICAL DATA:  Unresponsive patient. History of schizophrenia, hypertension, diabetes and dementia . EXAM: CT HEAD WITHOUT CONTRAST TECHNIQUE: Contiguous axial images were obtained from the base of the skull through the vertex without intravenous contrast. COMPARISON:  06/01/2016 FINDINGS: Brain: The patient has developed geographic areas of low-density throughout the cerebellum consistent with multiple cerebellar infarctions. The pons is also low-density in there is probably some involvement of the brainstem. These areas show mild swelling but no hemorrhage. There is new low density also seen in the right occipital lobe consistent with PCA territory infarction. The remainder the brain serve by the anterior circulation shows chronic atrophy and small vessel disease but no acute finding. No mass lesion. No hydrocephalus. No extra-axial collection. Vascular: Extensive atherosclerotic calcification of the major vessels at the base of the brain. Skull: Normal Sinuses/Orbits: Clear/normal Other: None significant IMPRESSION: Areas of low-density now throughout the cerebellum, affecting the pons and affecting the right occipital lobe consistent with acute infarctions. This either represents posterior circulation embolic disease or thrombotic disease. No hemorrhage. Electronically Signed   By: Nelson Chimes M.D.   On: 06/18/2016 16:19   Ct Angio Neck W Or Wo Contrast  Result Date: 06/18/2016 CLINICAL DATA:  Unresponsive, acute diabetic ketoacidosis. History of hypertension, diabetes, schizophrenia and dementia. EXAM: CT ANGIOGRAPHY HEAD AND NECK TECHNIQUE: Multidetector CT  imaging of the head and neck was performed using the standard protocol during bolus administration of intravenous contrast. Multiplanar CT image reconstructions and MIPs were obtained to evaluate the vascular anatomy. Carotid stenosis measurements (when applicable) are obtained utilizing NASCET criteria, using the distal internal carotid diameter as the denominator. CONTRAST:  75 cc Isovue 370 COMPARISON:  CT HEAD June 18, 2016 at 1544 hours and CT HEAD June 16, 2016 FINDINGS: CTA NECK AORTIC ARCH: Normal appearance of the thoracic arch, normal branch pattern. The origins of the innominate, left Common carotid artery and subclavian artery are patent, moderate intimal thickening calcific atherosclerosis. Mild stenosis distal RIGHT subclavian artery due to calcific atherosclerosis. RIGHT CAROTID SYSTEM: Common carotid artery is widely patent, coursing in a straight line fashion. Normal appearance of the carotid  bifurcation without hemodynamically significant stenosis by NASCET criteria, mild eccentric atherosclerosis. Normal appearance of the included internal carotid artery. LEFT CAROTID SYSTEM: Common carotid artery is widely patent, coursing in a straight line fashion. Normal appearance of the carotid bifurcation without hemodynamically significant stenosis by NASCET criteria, mild eccentric atherosclerosis. Normal appearance of the included internal carotid artery. VERTEBRAL ARTERIES:Left vertebral artery is dominant. Normal appearance of the vertebral arteries, which appear widely patent. SKELETON: No acute osseous process though bone windows have not been submitted. Moderate to severe C5-6 and C6-7 degenerative disc. OTHER NECK: Soft tissues of the neck are non-acute though, not tailored for evaluation. Endotracheal tube tip 1-2 cm above the carina. Fluid distended esophagus, incompletely characterized. RIGHT internal jugular central venous catheter, distal tip not included. Small calcified mediastinal lymph  nodes. CTA HEAD ANTERIOR CIRCULATION: Patent cervical internal carotid arteries, petrous, cavernous and supra clinoid internal carotid arteries. Moderate stenosis RIGHT supraclinoid internal carotid artery. LEFT A1 segment is dominant. Widely patent anterior communicating artery. Normal appearance of the anterior and middle cerebral arteries. No large vessel occlusion, hemodynamically significant stenosis, dissection, luminal irregularity, contrast extravasation or aneurysm. POSTERIOR CIRCULATION: Widely patent vertebral arteries, vertebrobasilar junction and basilar artery, as well as main branch vessels. Mild atherosclerosis bilateral V4 segments. Bilateral posterior cerebral artery is patent. Tandem mild stenoses RIGHT P1 segment. No large vessel occlusion, hemodynamically significant stenosis, dissection, contrast extravasation or aneurysm. VENOUS SINUSES: Major dural venous sinuses are patent though not tailored for evaluation on this angiographic examination. ANATOMIC VARIANTS: None. DELAYED PHASE: Not performed. MIP images reviewed. IMPRESSION: CTA NECK: No acute vascular process or hemodynamically significant stenosis. Fluid distended esophagus concerning for achalasia presents aspiration risk. CTA HEAD:  No emergent large vessel occlusion or severe stenosis. Moderate stenosis RIGHT supraclinoid internal carotid artery. Mild intracranial atherosclerosis. Electronically Signed   By: Elon Alas M.D.   On: 06/18/2016 17:56   Dg Chest Port 1 View  Result Date: 07-01-2016 CLINICAL DATA:  76 year old female intubated, respiratory failure. Cerebellar infarcts. EXAM: PORTABLE CHEST 1 VIEW COMPARISON:  06/18/2016 and earlier. FINDINGS: Portable AP semi upright view at at 0440 hours. The patient is now rotated to the left. Endotracheal tube tip projects about 14 mm above the carina. Enteric tube courses to the left abdomen, tip not included. Stable right IJ central line. Allowing for patient rotation  mediastinal contours appear stable. Calcified aortic atherosclerosis. No pneumothorax, pulmonary edema, pleural effusion or confluent pulmonary opacity identified. IMPRESSION: 1. Endotracheal tube tip 14 mm above the carina. Otherwise stable lines and tubes. 2. Rotated.  No acute cardiopulmonary abnormality identified. 3.  Calcified aortic atherosclerosis. Electronically Signed   By: Genevie Ann M.D.   On: 07/01/16 07:26   Dg Chest Port 1 View  Result Date: 06/18/2016 CLINICAL DATA:  Central line and endotracheal tube placement. EXAM: PORTABLE CHEST 1 VIEW COMPARISON:  06/18/2016. FINDINGS: Endotracheal tube terminates 2.7 cm above the carina. Right IJ central line tip projects over the low SVC. No pneumothorax. Heart size normal. Calcified mediastinal and hilar lymph nodes. Lungs are somewhat low in volume. Probable granuloma in the right midlung zone. Lungs are otherwise clear. No pleural fluid. IMPRESSION: 1. Satisfactory endotracheal tube and right IJ central line placement. No pneumothorax. 2. No acute findings in the chest. Electronically Signed   By: Lorin Picket M.D.   On: 06/18/2016 15:04   Dg Abd Portable 1v  Result Date: 2016/07/01 CLINICAL DATA:  Orogastric tube placement.  Initial encounter. EXAM: PORTABLE ABDOMEN - 1 VIEW COMPARISON:  None. FINDINGS: The patient's enteric tube is noted ending overlying the antrum of the stomach. A right central line is noted ending overlying the mid to distal SVC. The visualized bowel gas pattern is unremarkable. Scattered air and stool filled loops of colon are seen; no abnormal dilatation of small bowel loops is seen to suggest small bowel obstruction. No free intra-abdominal air is identified, though evaluation for free air is limited on a single supine view. The visualized osseous structures are within normal limits; the sacroiliac joints are unremarkable in appearance. The visualized portions of the lungs are grossly clear. There is contrast filling the  left renal calyces. IMPRESSION: 1. Enteric tube noted ending overlying the antrum of the stomach. 2. Unremarkable bowel gas pattern; no free intra-abdominal air seen. Small to moderate amount of stool noted in the colon. Electronically Signed   By: Garald Balding M.D.   On: 20-Jun-2016 01:08    Assessment: 76 y.o. female admitted in DKA, now unresponsive. Head CT performed on yesterday reviewed and shows hypodense areas in the cerebellum, pons and right occipital lobe.  Concern was for a posterior circulation thrombus so CTA of head and neck were recommended STAT.  Echocardiogram recommended as well.  No evidence of posterior circulation occlusion was noted.  Echocardiogram shows no cardiac source of emboli with an EF of 55-60%.  Patient has remained unresponsive.  Does have evidence of brain stem function on neurological examination.  Would like to rule out the possibility of PRES as well.    Stroke Risk Factors - diabetes mellitus and hypertension  Plan: 1. HgbA1c, fasting lipid panel 2. MRI of the brain without contrast 3. Prophylactic therapy-Antiplatelet med: Aspirin - dose 367m rectally, daily 4. Telemetry monitoring 5. Frequent neuro checks  Case discussed with Dr. SSherlene Shams MD Neurology [id number]-Mar-2018 10:09 AM

## 2016-06-22 NOTE — Progress Notes (Signed)
PULMONARY / CRITICAL CARE MEDICINE   Name: Laurie Morton MRN: [id number] DOB: 08/10/1940    ADMISSION DATE:  06/18/2016 CONSULTATION DATE:  06/18/2016  REFERRING MD:  Dr. Cherlynn KaiserSainani  CHIEF COMPLAINT:  Altered Mental Status and Hyperglycemia  PT PROFILE: [age] F with dementia, DM 2admitted to SDU/Hospitalist service 03/26 with AMS and severe hyperglycemia. Treated with insulin infusion and transferred out of SDU 03/27. Returned to ICU 03/29 with acute onset of coma, bradypnea, hypotension requiring intubation.  MAJOR EVENTS/TEST RESULTS: 03/26 admission with hyperglycemia, AMS 03/27 Transferred to med-surg 03/29 abrupt change in mental status - comatose. Transferred to ICU and intubated 03/28 CT head: Areas of low-density now throughout the cerebellum, affecting the pons and affecting the right occipital lobe consistent with acute infarctions. This either represents posterior circulation embolic disease or thrombotic disease  03/28 CTA head/neck: No emergent large vessel occlusion or severe stenosis 03/28 Echocardiogram: LVEF 55-60%. No cardioembolic source identified  03/29 Neurology consultation 03/29 MRI brain: Widespread acute infarcts in the bilateral cerebellum, bilateral occipital and parietal lobes, superior frontal gyri, but also confluently affecting the right mesial temporal lobe, right corpus callosum, right thalamus, and right lateral medulla. Widespread associated cytotoxic edema. Multifocal petechial hemorrhage. No significant intracranial mass effect at this time. Certain aspects of this pattern are most suggestive of widespread acute thromboembolic infarcts, but an atypical presentation of severe PRES (posterior reversible encephalopathy syndrome) is possible.  INDWELLING DEVICES:: ETT 03/28 >>  R IJ CVL 03/28 >>   MICRO DATA: Urine 03/26 >> NEG MRSA PCR 03/27 >> NEG  ANTIMICROBIALS:     SUBJECTIVE:  Comatose  VITAL SIGNS: BP (!) 151/68   Pulse 91   Temp 98.5  F (36.9 C) (Oral)   Resp 16   Ht 5\' 4"  (1.626 m)   Wt 116 lb 8 oz (52.8 kg)   SpO2 100%   BMI 20.00 kg/m   HEMODYNAMICS:    VENTILATOR SETTINGS: Vent Mode: PRVC FiO2 (%):  [30 %-40 %] 30 % Set Rate:  [14 bmp-16 bmp] 14 bmp Vt Set:  [450 mL] 450 mL PEEP:  [5 cmH20] 5 cmH20  INTAKE / OUTPUT: I/O last 3 completed shifts: In: 734.2 [I.V.:684.2; IV Piggyback:50] Out: 1640 [Urine:1640]  PHYSICAL EXAMINATION: General:  Comatose on no sedatives Neuro:  Pupils symmetric, Doll's eyes present, no corneal reflex, decreased tone, DTRs absent, no spont movement HEENT: NCAT, sclerae white Cardiovascular:  REg, soft syst M Lungs: no adventitious sounds Abdomen:  BS present, Soft, Non-tender, Non-distended Ext: No edema  LABS:  BMET  Recent Labs Lab 06/17/16 1127 06/18/16 0505 Jul 03, 2016 0508  NA 148* 140 147*  K 4.2  4.3 4.1 3.6  CL 112* 110 116*  CO2 29 25 25   BUN 80* 59* 42*  CREATININE 1.05* 1.12* 1.02*  GLUCOSE 136* 438* 169*    Electrolytes  Recent Labs Lab 06/17/16 1127 06/18/16 0505 Jul 03, 2016 0508  CALCIUM 9.7 9.1 9.2  MG 2.1  --   --     CBC  Recent Labs Lab 06/17/16 0417 06/18/16 0505 Jul 03, 2016 0508  WBC 12.2* 7.8 7.5  HGB 13.9 10.9* 10.3*  HCT 40.1 31.0* 28.9*  PLT 192 138* 121*    Coag's No results for input(s): APTT, INR in the last 168 hours.  Sepsis Markers No results for input(s): LATICACIDVEN, PROCALCITON, O2SATVEN in the last 168 hours.  ABG  Recent Labs Lab 06/17/16 0007 06/18/16 1530  PHART 7.43 7.53*  PCO2ART 36 30*  PO2ART 95 156*    Liver  Enzymes  Recent Labs Lab Jul 05, 2016 2206 05/27/2016 0508  AST 18 29  ALT 13* 10*  ALKPHOS 97 65  BILITOT 1.1 0.5  ALBUMIN 4.4 3.0*    Cardiac Enzymes  Recent Labs Lab 2016/07/05 2206 06/18/16 1900  TROPONINI 0.04* 0.05*    Glucose  Recent Labs Lab 06/18/16 1241 06/18/16 1617 06/18/16 1959 06/18/16 2352 06/15/2016 0403 06/09/2016 0714  GLUCAP 266* 269* 138* 125* 149*  195*    CXR: NACPD   ASSESSMENT / PLAN:  PULMONARY A: Ventilator dependent respiratory failure due to AMS P:   Cont full vent support - settings reviewed and/or adjusted Cont vent bundle Daily SBT if/when meets criteria  CARDIOVASCULAR A:  Hypotension, resolved Hx: HTN P:  Continuous Telemetry Monitoring PRM hydralazine if needed to maintain SBP <170 & DBP<100,   RENAL A:   AKI, nonoliguric, Cr improving Mild hypernatremia P:   Monitor BMET intermittently Monitor I/Os Correct electrolytes as indicated Allow hypernatremia due to risk of cerebral edema  GASTROINTESTINAL A:   No Acute Issues P:   SUP: enteral famotidine TF protocol initiate 03/29  HEMATOLOGIC A:   Mild anemia without acute blood loss P:  DVT px: SQ heparin Monitor CBC intermittently Transfuse per usual guidelines  INFECTIOUS A:   No acute issues P:   Monitor temp, WBC count Micro and abx as above  ENDOCRINE A:   DM2, adequately controlled P:   Continue SSI protocol - moderate scale Continue Lantus - increased 03/29 as TFs initiated  NEUROLOGIC A:   Coma Widespread bilateral brainstem and cerebral infarcts (vs less likely atypical PRES) Hx: Dementia P:   RASS goal: 0 to -1 Daily WUA Continue ASA & Lipitor Neurology Consulted, appreciate input  Need to address goals of care with pt's family. Sons to be in today   CCM time: 40 mins The above time includes time spent in consultation with patient and/or family members and reviewing care plan on multidisciplinary rounds  Billy Fischer, MD PCCM service Mobile [phone] Pager [phone]  06/18/2016, 11:29 AM

## 2016-06-22 NOTE — Discharge Summary (Signed)
DEATH SUMMARY  DATE OF ADMISSION:  06/21/16  DATE OF DISCHARGE/DEATH:  06-24-16  ADMISSION DIAGNOSES:   DKA  Essential hypertension Dementia Dyslipidemia  DISCHARGE DIAGNOSES:   DKA  Essential hypertension Dementia Dyslipidemia Ventilator dependent respiratory failure due to AMS Hypotension, resolved AKI, nonoliguric Mild hypernatremia Mild anemia without acute blood loss DM2, controlled Coma Multifocal bilateral brainstem and cerebral infarcts   PRESENTATION:   Pt was admitted to the [facility] with the following HPI and the above admission diagnoses:  Laurie Morton  is a 76 y.o. female who presents with DKA. Patient's family found her on the floor in her bathroom. They stated that they feel like she crawled there. She has dementia, but is significantly more altered than baseline. Patient herself is unable to contribute to her history of present illness due to her dementia and her current medical state. Hospitalists were called for admission and treatment.  HOSPITAL COURSE:   06/22/2022 admission with hyperglycemia, AMS 03/27 Transferred to med-surg 2022-06-25 abrupt change in mental status - comatose. Transferred to ICU and intubated 03/28 CT head: Areas of low-density now throughout the cerebellum, affecting the pons and affecting the right occipital lobe consistent with acute infarctions. This either represents posterior circulation embolic disease or thrombotic disease  03/28 CTA head/neck: No emergent large vessel occlusion or severe stenosis 03/28 Echocardiogram: LVEF 55-60%. No cardioembolic source identified  2022/06/25 Neurology consultation 06-25-2022 MRI brain: Widespread acute infarcts in the bilateral cerebellum, bilateral occipital and parietal lobes, superior frontal gyri, but also confluently affecting the right mesial temporal lobe, right corpus callosum, right thalamus, and right lateral medulla. Widespread associated cytotoxic edema. Multifocal petechial hemorrhage.  No significant intracranial mass effect at this time. Certain aspects of this pattern are most suggestive of widespread acute thromboembolic infarcts, but an atypical presentation of severe PRES (posterior reversible encephalopathy syndrome) is possible. 25-Jun-2022 The following was documented by Dr Belia Heman: I have discussed prognosis with Laurie Morton over the phone, he was not able to come to ICU. I have explained MRI findings and patient has extensive brain injury. Arlys Morton has said to me  That patient has been progressively declining over last several years with dementia and her living will states no life support. He has consented and agreed to DNR status and he is aware that the family will need to make major decisions in the days to come 2022/06/25: Pt was extubated and transitioned to full comfort care. She expired peacefully shortly thereafter  Cause of death:  Acute multifocal CVA  Contributing factors: Dementia, DM2, Acute respiratory failure  Autopsy:  No  Smoking:  Unknown  Billy Fischer, MD PCCM service Mobile [phone] Pager [phone] 06/20/2016

## 2016-06-22 NOTE — Care Management (Signed)
Patient transferred back into icu with unresponsiveness and hypotension. Intubated. Brain MRI shows multiple acute infarcts

## 2016-06-22 NOTE — Death Summary Note (Signed)
DEATH SUMMARY   Patient Details  Name: Laurie Morton MRN: [id number] DOB: 07-12-1940  Admission/Discharge Information   Admit Date:  June 21, 2016  Date of Death: Date of Death: (P) 24-Jun-2016  Time of Death: Time of Death: ([id number]  Length of Stay: 2  Referring Physician: Tawny Asal, MD   Reason(s) for Hospitalization  June 22, 2022 admission with hyperglycemia, AMS 25-Jun-2022 abrupt change in mental status - comatose. Transferred to ICU and intubated  Diagnoses  Preliminary cause of death:  Widespread acute infarcts in the bilateral cerebellum, bilateral occipital and parietal lobes, superior frontal gyri, but also confluently affecting the right mesial temporal lobe, right corpus callosum, right thalamus, and right lateral medulla. Widespread associated cytotoxic edema. Multifocal petechial hemorrhage. No significant intracranial mass effect at this time. Certain aspects of this pattern are most suggestive of widespread Associated cytotoxic infarct Secondary Diagnoses (including complications and co-morbidities):  Principal Problem:   DKA (diabetic ketoacidoses) (HCC) Active Problems:   Essential hypertension   Dyslipidemia   GERD (gastroesophageal reflux disease)   Dementia   AKI (acute kidney injury) (HCC)   Encounter for central line placement   Endotracheal tube present   Unresponsive   Glasgow coma scale total score 3-8 (HCC)   Acute cerebral infarction (HCC) Ventilator dependent respiratory failure due to AMS  Brief Hospital Course (including significant findings, care, treatment, and services provided and events leading to death)  Laurie Morton was a 76 y.o. year old female withPMH of Schizophrenia, HTN, DM, Dementia.  She presented to [facility] on 21-Jun-2016  with c/o Altered Mental Status after being found on the bathroom floor by her family.  She was admitted to [facility] Stepdown unit for treatment of DKA and Altered Mental Status.  She was transferred out to Med/surg unit on 3/27.  On  3/28, she was found  unresponsive, hypotension, and bradypneic on Med/surg unit.   Therefore she was transferred to ICU for emergent intubation.  She was given IV Narcan with no improvement of unresponsiveness.  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  PCCM is consulted for further management of Acute Encephalopathy and Acute Respiratory Failure requiring mechanical ventilation.On 06-25-22 MRI was concerning for Widespread acute infarcts in the bilateral cerebellum, bilateraloccipital and parietal lobes, superior frontal gyri, but alsoconfluently affecting the right mesial temporal lobe, right corpuscallosum, right thalamus, and right lateral medulla. Widespreadassociated cytotoxic edema. Multifocal petechial hemorrhage.Therefore the family was made aware of the poor prognosis of the patient.  Family Agreed to make her DNR and expressed that patient never wanted to be on the life support.  So patient was made extubated and made her comfort care. Patient passed away shortly after extubation at 20:53 on 06/24/16.  Pertinent Labs and Studies  Significant Diagnostic Studies Ct Angio Head W Or Wo Contrast  Result Date: 06/18/2016 CLINICAL DATA:  Unresponsive, acute diabetic ketoacidosis. History of hypertension, diabetes, schizophrenia and dementia. EXAM: CT ANGIOGRAPHY HEAD AND NECK TECHNIQUE: Multidetector CT imaging of the head and neck was performed using the standard protocol during bolus administration of intravenous contrast. Multiplanar CT image reconstructions and MIPs were obtained to evaluate the vascular anatomy. Carotid stenosis measurements (when applicable) are obtained utilizing NASCET criteria, using the distal internal carotid diameter as the denominator. CONTRAST:  75 cc Isovue 370 COMPARISON:  CT HEAD June 18, 2016 at 1544 hours and CT HEAD 06/21/16 FINDINGS: CTA NECK AORTIC ARCH: Normal appearance of the thoracic arch, normal branch  pattern. The origins of the innominate, left Common carotid artery and subclavian artery are patent, moderate intimal thickening calcific atherosclerosis. Mild stenosis distal RIGHT subclavian artery due to calcific atherosclerosis. RIGHT CAROTID SYSTEM: Common carotid artery is widely patent, coursing in a straight line fashion. Normal appearance of the carotid bifurcation without hemodynamically significant stenosis by NASCET criteria, mild eccentric atherosclerosis. Normal appearance of the included internal carotid artery. LEFT CAROTID SYSTEM: Common carotid artery is widely patent, coursing in a straight line fashion. Normal appearance of the carotid bifurcation without hemodynamically significant stenosis by NASCET criteria, mild eccentric atherosclerosis. Normal appearance of the included internal carotid artery. VERTEBRAL ARTERIES:Left vertebral artery is dominant. Normal appearance of the vertebral arteries, which appear widely patent. SKELETON: No acute osseous process though bone windows have not been submitted. Moderate to severe C5-6 and C6-7 degenerative disc. OTHER NECK: Soft tissues of the neck are non-acute though, not tailored for evaluation. Endotracheal tube tip 1-2 cm above the carina. Fluid distended esophagus, incompletely characterized. RIGHT internal jugular central venous catheter, distal tip not included. Small calcified mediastinal lymph nodes. CTA HEAD ANTERIOR CIRCULATION: Patent cervical internal carotid arteries, petrous, cavernous and supra clinoid internal carotid arteries. Moderate stenosis RIGHT supraclinoid internal carotid artery. LEFT A1 segment is dominant. Widely patent anterior communicating artery. Normal appearance of the anterior and middle cerebral arteries. No large vessel occlusion, hemodynamically significant stenosis, dissection, luminal irregularity, contrast extravasation or aneurysm. POSTERIOR CIRCULATION: Widely patent vertebral arteries, vertebrobasilar junction  and basilar artery, as well as main branch vessels. Mild atherosclerosis bilateral V4 segments. Bilateral posterior cerebral artery is patent. Tandem mild stenoses RIGHT P1 segment. No large vessel occlusion, hemodynamically significant stenosis, dissection, contrast extravasation or aneurysm. VENOUS SINUSES: Major dural venous sinuses are patent though not tailored for evaluation on this angiographic examination. ANATOMIC VARIANTS: None. DELAYED PHASE: Not performed. MIP images reviewed. IMPRESSION: CTA NECK: No acute vascular process or hemodynamically significant stenosis. Fluid distended esophagus concerning for achalasia presents aspiration risk. CTA HEAD:  No emergent large vessel occlusion or severe stenosis. Moderate stenosis RIGHT supraclinoid internal carotid artery. Mild intracranial atherosclerosis. Electronically Signed   By: Awilda Metro M.D.   On: 06/18/2016 17:56   Ct Head Wo Contrast  Result Date: 06/18/2016 CLINICAL DATA:  Unresponsive patient. History of schizophrenia, hypertension, diabetes and dementia . EXAM: CT HEAD WITHOUT CONTRAST TECHNIQUE: Contiguous axial images were obtained from the base of the skull through the vertex without intravenous contrast. COMPARISON:  06/18/2016 FINDINGS: Brain: The patient has developed geographic areas of low-density throughout the cerebellum consistent with multiple cerebellar infarctions. The pons is also low-density in there is probably some involvement of the brainstem. These areas show mild swelling but no hemorrhage. There is new low density also seen in the right occipital lobe consistent with PCA territory infarction. The remainder the brain serve by the anterior circulation shows chronic atrophy and small vessel disease but no acute finding. No mass lesion. No hydrocephalus. No extra-axial collection. Vascular: Extensive atherosclerotic calcification of the major vessels at the base of the brain. Skull: Normal Sinuses/Orbits: Clear/normal  Other: None significant IMPRESSION: Areas of low-density now throughout the cerebellum, affecting the pons and affecting the right occipital lobe consistent with acute infarctions. This either represents posterior circulation embolic disease or thrombotic disease. No hemorrhage. Electronically Signed   By: Paulina Fusi M.D.   On: 06/18/2016 16:19   Ct Head Wo Contrast  Result Date: 06/17/2016 CLINICAL DATA:  Possible syncope. Dementia patient, found on bathroom floor. EXAM: CT HEAD WITHOUT  CONTRAST TECHNIQUE: Contiguous axial images were obtained from the base of the skull through the vertex without intravenous contrast. COMPARISON:  No prior head CT.  Brain MRI 03/22/2015 FINDINGS: Brain: No acute hemorrhage. 11 mm hyperdensity in the inferior right frontal lobe is likely a cavernoma and corresponds to that seen on prior MRI, unchanged in size. No evidence of surrounding edema. No CT findings of acute ischemia. No mass effect or midline shift. No hydrocephalus. Mild atrophy is stable. Periventricular white matter changes, nonspecific but likely chronic small vessel ischemia, stable. No extra-axial fluid collection. Vascular: Atherosclerosis of skullbase vasculature without hyperdense vessel or abnormal calcification. Skull: Normal. Negative for fracture or focal lesion. Sinuses/Orbits: Paranasal sinuses and mastoid air cells are clear. The visualized orbits are unremarkable. Other: None. IMPRESSION: 1. No acute abnormality. 2. Mild chronic small vessel ischemia. 3. 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 Electronically Signed   By: Rubye OaksMelanie  Ehinger M.D.   On: 06/17/2016 00:23   Ct Angio Neck W Or Wo Contrast  Result Date: 06/18/2016 CLINICAL DATA:  Unresponsive, acute diabetic ketoacidosis. History of hypertension, diabetes, schizophrenia and dementia. EXAM: CT ANGIOGRAPHY HEAD AND NECK TECHNIQUE: Multidetector CT imaging of the head and neck was performed using  the standard protocol during bolus administration of intravenous contrast. Multiplanar CT image reconstructions and MIPs were obtained to evaluate the vascular anatomy. Carotid stenosis measurements (when applicable) are obtained utilizing NASCET criteria, using the distal internal carotid diameter as the denominator. CONTRAST:  75 cc Isovue 370 COMPARISON:  CT HEAD June 18, 2016 at 1544 hours and CT HEAD June 16, 2016 FINDINGS: CTA NECK AORTIC ARCH: Normal appearance of the thoracic arch, normal branch pattern. The origins of the innominate, left Common carotid artery and subclavian artery are patent, moderate intimal thickening calcific atherosclerosis. Mild stenosis distal RIGHT subclavian artery due to calcific atherosclerosis. RIGHT CAROTID SYSTEM: Common carotid artery is widely patent, coursing in a straight line fashion. Normal appearance of the carotid bifurcation without hemodynamically significant stenosis by NASCET criteria, mild eccentric atherosclerosis. Normal appearance of the included internal carotid artery. LEFT CAROTID SYSTEM: Common carotid artery is widely patent, coursing in a straight line fashion. Normal appearance of the carotid bifurcation without hemodynamically significant stenosis by NASCET criteria, mild eccentric atherosclerosis. Normal appearance of the included internal carotid artery. VERTEBRAL ARTERIES:Left vertebral artery is dominant. Normal appearance of the vertebral arteries, which appear widely patent. SKELETON: No acute osseous process though bone windows have not been submitted. Moderate to severe C5-6 and C6-7 degenerative disc. OTHER NECK: Soft tissues of the neck are non-acute though, not tailored for evaluation. Endotracheal tube tip 1-2 cm above the carina. Fluid distended esophagus, incompletely characterized. RIGHT internal jugular central venous catheter, distal tip not included. Small calcified mediastinal lymph nodes. CTA HEAD ANTERIOR CIRCULATION: Patent  cervical internal carotid arteries, petrous, cavernous and supra clinoid internal carotid arteries. Moderate stenosis RIGHT supraclinoid internal carotid artery. LEFT A1 segment is dominant. Widely patent anterior communicating artery. Normal appearance of the anterior and middle cerebral arteries. No large vessel occlusion, hemodynamically significant stenosis, dissection, luminal irregularity, contrast extravasation or aneurysm. POSTERIOR CIRCULATION: Widely patent vertebral arteries, vertebrobasilar junction and basilar artery, as well as main branch vessels. Mild atherosclerosis bilateral V4 segments. Bilateral posterior cerebral artery is patent. Tandem mild stenoses RIGHT P1 segment. No large vessel occlusion, hemodynamically significant stenosis, dissection, contrast extravasation or aneurysm. VENOUS SINUSES: Major dural venous sinuses are patent though not tailored for evaluation on this angiographic  examination. ANATOMIC VARIANTS: None. DELAYED PHASE: Not performed. MIP images reviewed. IMPRESSION: CTA NECK: No acute vascular process or hemodynamically significant stenosis. Fluid distended esophagus concerning for achalasia presents aspiration risk. CTA HEAD:  No emergent large vessel occlusion or severe stenosis. Moderate stenosis RIGHT supraclinoid internal carotid artery. Mild intracranial atherosclerosis. Electronically Signed   By: Awilda Metro M.D.   On: 06/18/2016 17:56   Mr Laqueta Jean ZO Contrast  Result Date: 06-28-2016 CLINICAL DATA:  76 year old female with diabetic ketoacidosis, found unresponsive. Bilateral cerebellar infarcts evident on head CT yesterday. CTA head and neck negative for emergent large vessel occlusion. EXAM: MRI HEAD WITHOUT AND WITH CONTRAST TECHNIQUE: Multiplanar, multiecho pulse sequences of the brain and surrounding structures were obtained without and with intravenous contrast. CONTRAST:  10mL MULTIHANCE GADOBENATE DIMEGLUMINE 529 MG/ML IV SOLN COMPARISON:  CTA head  and neck and noncontrast head CT 06/18/2016. Brain MRI 03/22/2015. FINDINGS: Brain: Confluent restricted diffusion in the bilateral superior frontal gyrus cortex, bilateral posterior and superior parietal lobe cortex and white matter, patchy bilateral occipital lobe white matter in cortex, worse on the right, throughout the right mesial temporal lobe (anteriorly to the right amygdala), and involving moderate-sized areas of both cerebellar hemispheres. Superimposed small areas of restricted diffusion also in the body of the right corpus callosum (series 100, image 39), anterior right middle frontal gyrus, left middle frontal gyrus subcortical white matter, right thalamus, left cerebral peduncle, and possibly also the right lateral medulla (series 100, image 11). Associated confluent T2 and FLAIR hyperintensity in the acutely affected areas most resembling cytotoxic edema. Associated petechial hemorrhage in the bilateral cerebellum, right occipital pole, and left parietal lobe involvement. Luxury perfusion type enhancement only in the right mesial temporal lobe. No significant intracranial mass effect despite the above findings. No ventriculomegaly or intraventricular hemorrhage. Superimposed innumerable chronic micro hemorrhages scattered throughout the brain including the brainstem and cerebellum. Basilar cisterns remain patent. Negative pituitary and cervicomedullary junction. No extra-axial hemorrhage or collection. Vascular: Major intracranial vascular flow voids are stable since 2016. Skull and upper cervical spine: Negative. Visualized bone marrow signal is within normal limits. Sinuses/Orbits: Stable and negative. Other: Small volume fluid in the pharynx. Mastoids remain clear. Visible internal auditory structures appear normal. No acute scalp soft tissue findings. IMPRESSION: 1. Widespread acute infarcts in the bilateral cerebellum, bilateral occipital and parietal lobes, superior frontal gyri, but also  confluently affecting the right mesial temporal lobe, right corpus callosum, right thalamus, and right lateral medulla. Widespread associated cytotoxic edema. Multifocal petechial hemorrhage. No significant intracranial mass effect at this time. 2. Certain aspects of this pattern are most suggestive of widespread acute thromboembolic infarcts, but an atypical presentation of severe PRES (posterior reversible encephalopathy syndrome) is possible. 3. Underlying chronic severe cerebral microhemorrhage disease, suggestive of amyloid angiopathy. Electronically Signed   By: Odessa Fleming M.D.   On: June 28, 2016 12:11   Dg Chest Port 1 View  Result Date: 2016-06-28 CLINICAL DATA:  76 year old female intubated, respiratory failure. Cerebellar infarcts. EXAM: PORTABLE CHEST 1 VIEW COMPARISON:  06/18/2016 and earlier. FINDINGS: Portable AP semi upright view at at 0440 hours. The patient is now rotated to the left. Endotracheal tube tip projects about 14 mm above the carina. Enteric tube courses to the left abdomen, tip not included. Stable right IJ central line. Allowing for patient rotation mediastinal contours appear stable. Calcified aortic atherosclerosis. No pneumothorax, pulmonary edema, pleural effusion or confluent pulmonary opacity identified. IMPRESSION: 1. Endotracheal tube tip 14 mm above the carina. Otherwise stable lines  and tubes. 2. Rotated.  No acute cardiopulmonary abnormality identified. 3.  Calcified aortic atherosclerosis. Electronically Signed   By: Odessa Fleming M.D.   On: 06/01/2016 07:26   Dg Chest Port 1 View  Result Date: 06/18/2016 CLINICAL DATA:  Central line and endotracheal tube placement. EXAM: PORTABLE CHEST 1 VIEW COMPARISON:  06-17-2016. FINDINGS: Endotracheal tube terminates 2.7 cm above the carina. Right IJ central line tip projects over the low SVC. No pneumothorax. Heart size normal. Calcified mediastinal and hilar lymph nodes. Lungs are somewhat low in volume. Probable granuloma in the right  midlung zone. Lungs are otherwise clear. No pleural fluid. IMPRESSION: 1. Satisfactory endotracheal tube and right IJ central line placement. No pneumothorax. 2. No acute findings in the chest. Electronically Signed   By: Leanna Battles M.D.   On: 06/18/2016 15:04   Dg Chest Portable 1 View  Result Date: 06/17/2016 CLINICAL DATA:  Possible syncope. Dementia patient with hyperglycemia, found on bathroom floor. EXAM: PORTABLE CHEST 1 VIEW COMPARISON:  Frontal and lateral views 11/23/2012 FINDINGS: The cardiomediastinal contours are normal. Calcified granuloma and mediastinal nodes. Pulmonary vasculature is normal. No consolidation, pleural effusion, or pneumothorax. No acute osseous abnormalities are seen. The bones appear under mineralized. IMPRESSION: No acute abnormality. Electronically Signed   By: Rubye Oaks M.D.   On: 06/17/2016 00:08   Dg Abd Portable 1v  Result Date: 06/15/2016 CLINICAL DATA:  Orogastric tube placement.  Initial encounter. EXAM: PORTABLE ABDOMEN - 1 VIEW COMPARISON:  None. FINDINGS: The patient's enteric tube is noted ending overlying the antrum of the stomach. A right central line is noted ending overlying the mid to distal SVC. The visualized bowel gas pattern is unremarkable. Scattered air and stool filled loops of colon are seen; no abnormal dilatation of small bowel loops is seen to suggest small bowel obstruction. No free intra-abdominal air is identified, though evaluation for free air is limited on a single supine view. The visualized osseous structures are within normal limits; the sacroiliac joints are unremarkable in appearance. The visualized portions of the lungs are grossly clear. There is contrast filling the left renal calyces. IMPRESSION: 1. Enteric tube noted ending overlying the antrum of the stomach. 2. Unremarkable bowel gas pattern; no free intra-abdominal air seen. Small to moderate amount of stool noted in the colon. Electronically Signed   By: Roanna Raider M.D.   On: 05/24/2016 01:08    Microbiology Recent Results (from the past 240 hour(s))  Urine culture     Status: None   Collection Time: June 17, 2016 10:49 PM  Result Value Ref Range Status   Specimen Description URINE, CATHETERIZED  Final   Special Requests Normal  Final   Culture   Final    NO GROWTH Performed at [facility], [address], [place], [place] [address]    Report Status 06/18/2016 FINAL  Final  MRSA PCR Screening     Status: None   Collection Time: 06/17/16  3:03 AM  Result Value Ref Range Status   MRSA by PCR NEGATIVE NEGATIVE Final    Comment:        The GeneXpert MRSA Assay (FDA approved for NASAL specimens only), is one component of a comprehensive MRSA colonization surveillance program. It is not intended to diagnose MRSA infection nor to guide or monitor treatment for MRSA infections.     Lab Basic Metabolic Panel:  Recent Labs Lab 06/17/16 0417 06/17/16 0803 06/17/16 1127 06/18/16 0505 06/17/2016 0508  NA 148* 152* 148* 140  147*  K 2.9* 3.4* 4.2  4.3 4.1 3.6  CL 110 114* 112* 110 116*  CO2 25 30 29 25 25   GLUCOSE 376* 83 136* 438* 169*  BUN 90* 86* 80* 59* 42*  CREATININE 1.42* 1.34* 1.05* 1.12* 1.02*  CALCIUM 10.1 9.9 9.7 9.1 9.2  MG  --   --  2.1  --   --    Liver Function Tests:  Recent Labs Lab 06/20/2016 2206 Jul 18, 2016 0508 07/18/16 1820  AST 18 29 34  ALT 13* 10* 11*  ALKPHOS 97 65  --   BILITOT 1.1 0.5  --   PROT 8.2* 5.9*  --   ALBUMIN 4.4 3.0*  --    No results for input(s): LIPASE, AMYLASE in the last 168 hours. No results for input(s): AMMONIA in the last 168 hours. CBC:  Recent Labs Lab 06/13/2016 2206 06/17/16 0417 06/18/16 0505 Jul 18, 2016 0508 07/18/2016 1820  WBC 15.8* 12.2* 7.8 7.5 6.3  NEUTROABS  --   --   --   --  4.9  HGB 14.8 13.9 10.9* 10.3* 7.9*  HCT 45.1 40.1 31.0* 28.9* 22.9*  MCV 93.2 89.5 91.0 89.1 91.3  PLT 213 192 138* 121* 118*   Cardiac Enzymes:  Recent Labs Lab 05/27/2016 2206  06/18/16 1900  TROPONINI 0.04* 0.05*   Sepsis Labs:  Recent Labs Lab 06/17/16 0417 06/18/16 0505 Jul 18, 2016 0508 07-18-2016 1820  WBC 12.2* 7.8 7.5 6.3    Procedures/Operations  3/28 >>Central Venous Catheter 3/28>>Oral Intubation   Bincy S Varughese 07-18-2016, 9:02 PM

## 2016-06-22 NOTE — Progress Notes (Signed)
Pt extubated to room air.

## 2016-06-22 NOTE — Progress Notes (Signed)
Chaplain was informed that the patient in ICU15 passed away and her two sons were inside the room. Chaplain expressed his condolences to the sons and offer all support the family needed. Patient's sons said that they believed their mother was no longer in pain and that gave them some comfort. Chaplain spent some times with the sons and then left them to spend a quality time with their deceases mother.   2016/11/28 2100  Clinical Encounter Type  Visited With Family  Visit Type Initial  Referral From Nurse  Consult/Referral To Chaplain  Spiritual Encounters  Spiritual Needs Grief support

## 2016-06-22 DEATH — deceased

## 2016-06-23 ENCOUNTER — Telehealth: Payer: Self-pay

## 2016-06-23 NOTE — Telephone Encounter (Signed)
Received death certificate from [employer] .  Placed in Lbpu box.

## 2016-06-23 NOTE — Telephone Encounter (Signed)
Received death cert and placed in DS folder.

## 2016-06-25 NOTE — Telephone Encounter (Signed)
Cert placed up front and funeral home informed ready for pick up.

## 2017-09-23 IMAGING — MR MR HEAD WO/W CM
12 series · 48 of 48 positions shown · IV contrast (multihance)
Comparison: CTA head and neck and noncontrast head CT 06/18/2016.
Brain MRI 03/22/2015.

CLINICAL DATA: 75-year-old female with diabetic ketoacidosis, found
unresponsive. Bilateral cerebellar infarcts evident on head CT
yesterday. CTA head and neck negative for emergent large vessel
occlusion.

EXAM:
MRI HEAD WITHOUT AND WITH CONTRAST
TECHNIQUE: Multiplanar, multiecho pulse sequences of the brain and surrounding
structures were obtained without and with intravenous contrast.
CONTRAST:  10mL MULTIHANCE GADOBENATE DIMEGLUMINE 529 MG/ML IV SOLN

[Series 2: T1 · sagittal · 5.0mm · 0.45mm/px · 1 of 27 slices shown (1 of 2)]
[im 1/27]
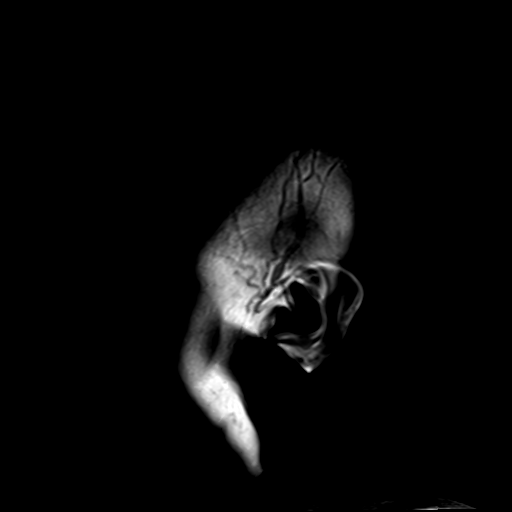

[Series 4: DWI · axial · 3.0mm · 1.80mm/px · z∈[-71,+87]mm · 3 of 54 slices shown (1 of 2)]
[im 1/54]
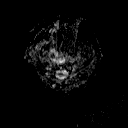
[im 27/54]
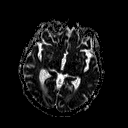
[im 54/54]
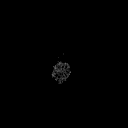

[Series 6: DWI · coronal · 3.0mm · 1.80mm/px · 3 of 45 slices shown (2 of 2)]
[im 1/45]
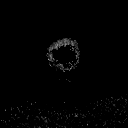
[im 23/45]
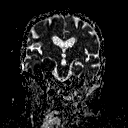
[im 45/45]
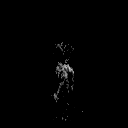

[Series 7: T2 · axial · 5.0mm · 0.60mm/px · z∈[-68,+87]mm · 2 of 25 slices shown (1 of 2)]
[im 1/25]
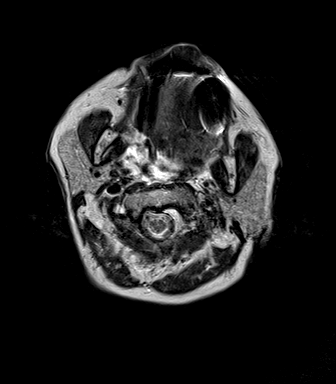
[im 25/25]
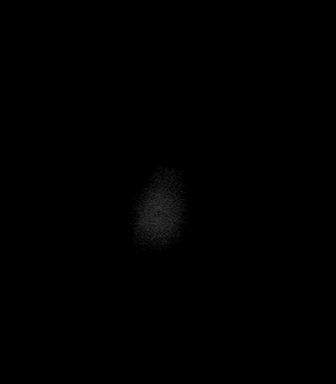

[Series 8: FLAIR · axial · 3.0mm · 0.45mm/px · z∈[-68,+87]mm · 3 of 53 slices shown]
[im 1/53]
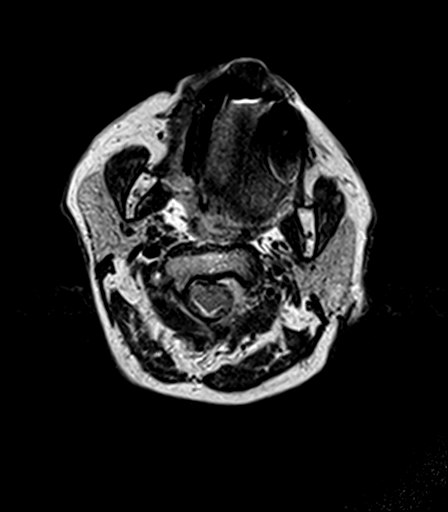
[im 27/53]
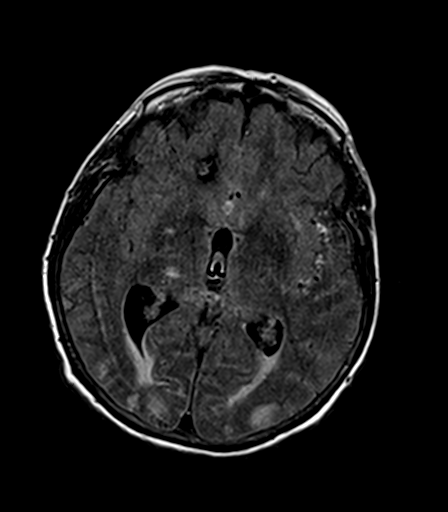
[im 53/53]
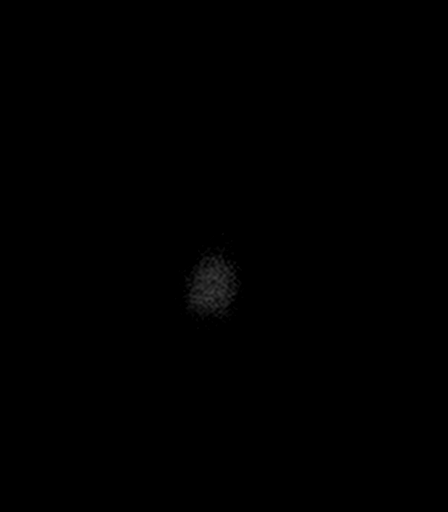

[Series 9: T1 · axial · 1.0mm · 1.00mm/px · z∈[-76,+98]mm · 12 of 176 slices shown (2 of 2)]
[im 1/176]
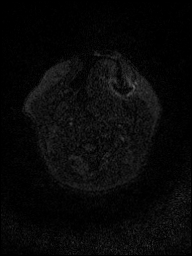
[im 16/176]
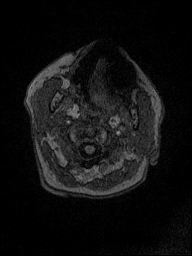
[im 32/176]
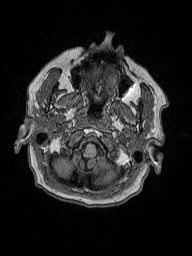
[im 48/176]
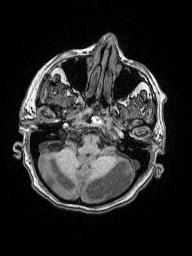
[im 64/176]
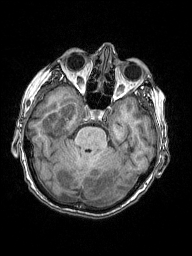
[im 80/176]
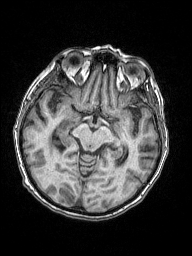
[im 96/176]
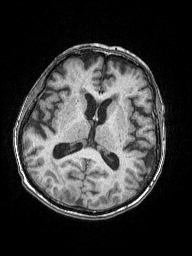
[im 112/176]
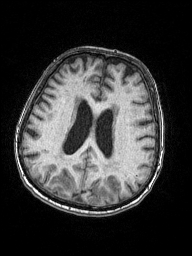
[im 128/176]
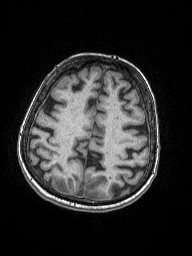
[im 144/176]
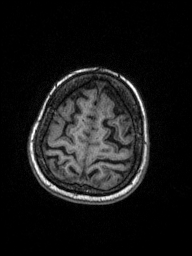
[im 160/176]
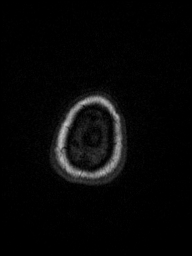
[im 176/176]
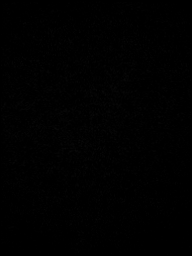

[Series 10: T2 · axial · 5.0mm · 0.45mm/px · z∈[-68,+87]mm · 2 of 25 slices shown (2 of 2)]
[im 1/25]
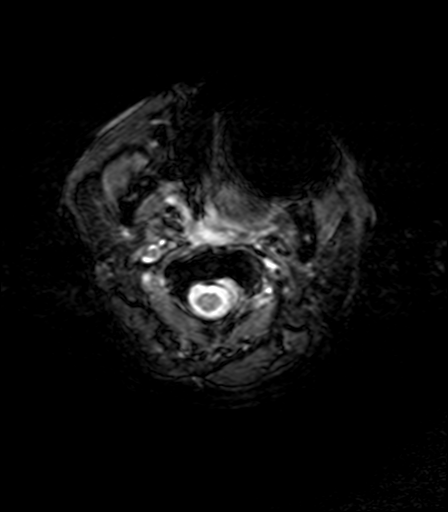
[im 25/25]
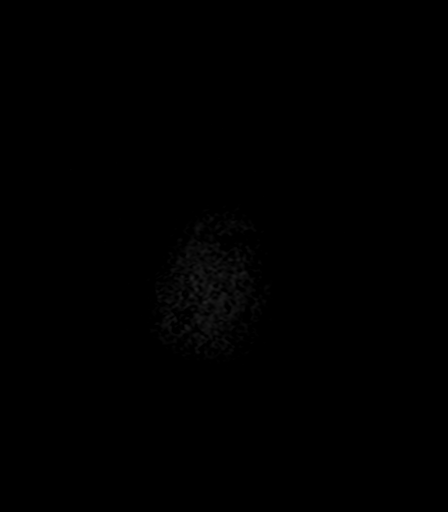

[Series 11: T2 post-contrast · coronal · 5.0mm · 0.49mm/px · 2 of 27 slices shown]
[im 1/27]
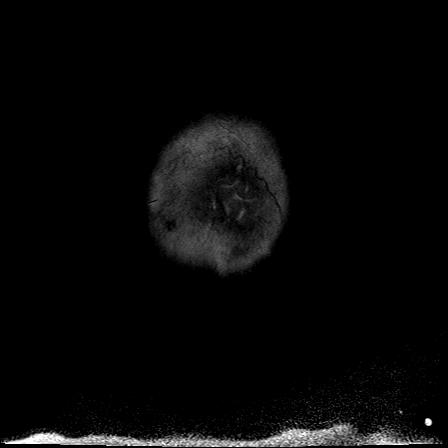
[im 27/27]
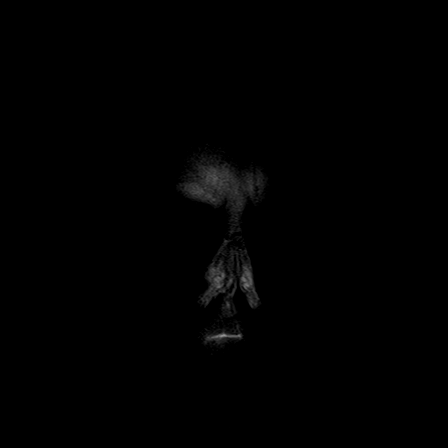

[Series 12: T1 post-contrast · axial · 1.0mm · 1.00mm/px · z∈[-76,+98]mm · 12 of 176 slices shown (1 of 2)]
[im 1/176]
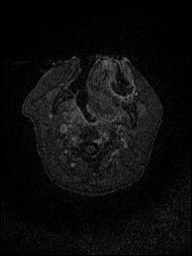
[im 16/176]
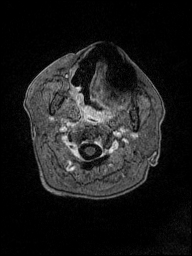
[im 32/176]
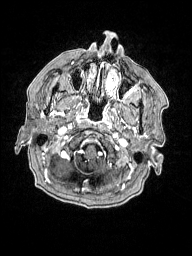
[im 48/176]
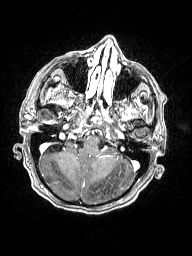
[im 64/176]
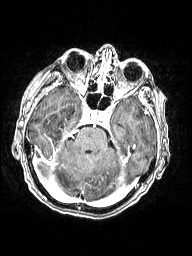
[im 80/176]
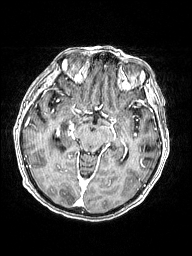
[im 96/176]
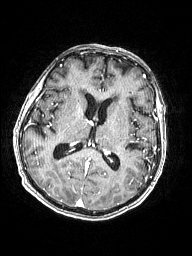
[im 112/176]
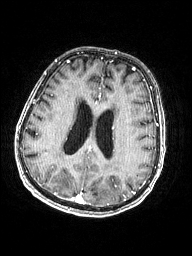
[im 128/176]
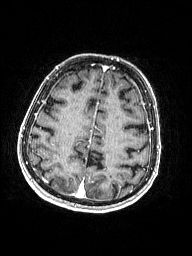
[im 144/176]
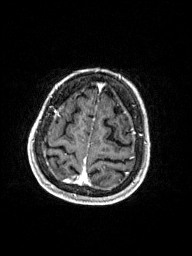
[im 160/176]
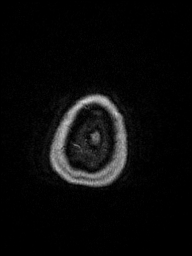
[im 176/176]
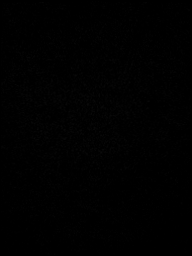

[Series 13: T1 post-contrast · coronal · 5.0mm · 0.43mm/px · 2 of 27 slices shown (2 of 2)]
[im 1/27]
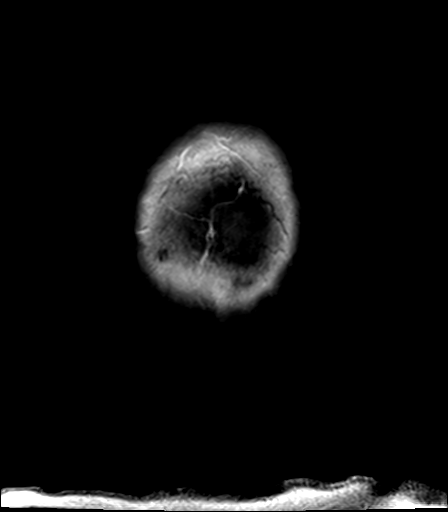
[im 27/27]
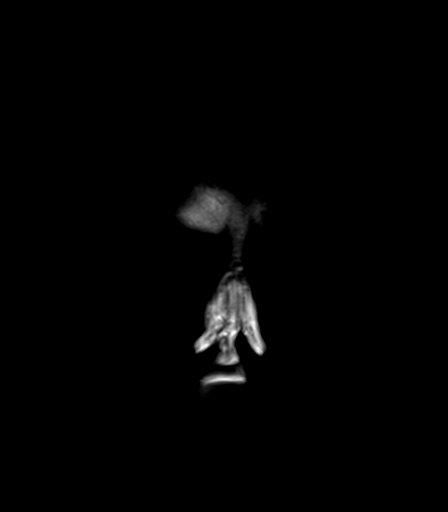

[Series 100: ax (id) · axial · 3.0mm · 1.80mm/px · z∈[-65,+90]mm · 3 of 53 slices shown]
[im 1/53]
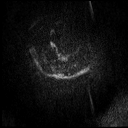
[im 27/53]
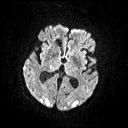
[im 53/53]
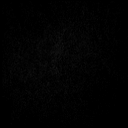

[Series 101: cor (id) · coronal · 3.0mm · 1.80mm/px · 3 of 44 slices shown]
[im 1/44]
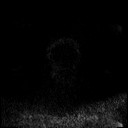
[im 22/44]
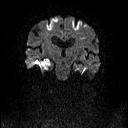
[im 44/44]
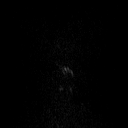

[48 of 48 positions shown; findings below may reference images not displayed]

FINDINGS: Brain: Confluent restricted diffusion in the bilateral superior
frontal gyrus cortex, bilateral posterior and superior parietal lobe
cortex and white matter, patchy bilateral occipital lobe white
matter in cortex, worse on the right, throughout the right mesial
temporal lobe (anteriorly to the right amygdala), and involving
moderate-sized areas of both cerebellar hemispheres.

Superimposed small areas of restricted diffusion also in the body of
the right corpus callosum (series 100, image 39), anterior right
middle frontal gyrus, left middle frontal gyrus subcortical white
matter, right thalamus, left cerebral peduncle, and possibly also
the right lateral medulla (series 100, image 11).

Associated confluent T2 and FLAIR hyperintensity in the acutely
affected areas most resembling cytotoxic edema. Associated petechial
hemorrhage in the bilateral cerebellum, right occipital pole, and
left parietal lobe involvement. Luxury perfusion type enhancement
only in the right mesial temporal lobe.

No significant intracranial mass effect despite the above findings.
No ventriculomegaly or intraventricular hemorrhage.

Superimposed innumerable chronic micro hemorrhages scattered
throughout the brain including the brainstem and cerebellum. Basilar
cisterns remain patent. Negative pituitary and cervicomedullary
junction. No extra-axial hemorrhage or collection.

Vascular: Major intracranial vascular flow voids are stable since
8992.

Skull and upper cervical spine: Negative. Visualized bone marrow
signal is within normal limits.

Sinuses/Orbits: Stable and negative.

Other: Small volume fluid in the pharynx. Mastoids remain clear.
Visible internal auditory structures appear normal. No acute scalp
soft tissue findings.
IMPRESSION: 1. Widespread acute infarcts in the bilateral cerebellum, bilateral
occipital and parietal lobes, superior frontal gyri, but also
confluently affecting the right mesial temporal lobe, right corpus
callosum, right thalamus, and right lateral medulla. Widespread
associated cytotoxic edema. Multifocal petechial hemorrhage. No
significant intracranial mass effect at this time.

2. Certain aspects of this pattern are most suggestive of widespread
acute thromboembolic infarcts, but an atypical presentation of
severe PRES (posterior reversible encephalopathy syndrome) is
possible.
3. Underlying chronic severe cerebral microhemorrhage disease,
suggestive of amyloid angiopathy.
